# Patient Record
Sex: Female | Born: 1950 | Hispanic: No | Marital: Married | Smoking: Former smoker
Health system: Southern US, Community
[De-identification: ages and names within clinical notes are randomized; demographics above are authoritative.]

## PROBLEM LIST (undated history)

## (undated) DIAGNOSIS — I10 Essential (primary) hypertension: Secondary | ICD-10-CM

## (undated) DIAGNOSIS — E7800 Pure hypercholesterolemia, unspecified: Secondary | ICD-10-CM

## (undated) DIAGNOSIS — E119 Type 2 diabetes mellitus without complications: Secondary | ICD-10-CM

## (undated) HISTORY — PX: VEIN LIGATION: SHX2652

## (undated) HISTORY — PX: TUBAL LIGATION: SHX77

## (undated) HISTORY — DX: Type 2 diabetes mellitus without complications: E11.9

## (undated) HISTORY — DX: Essential (primary) hypertension: I10

## (undated) HISTORY — DX: Pure hypercholesterolemia, unspecified: E78.00

## (undated) HISTORY — PX: COLONOSCOPY: SHX174

---

## 1999-12-17 ENCOUNTER — Encounter: Payer: Self-pay | Admitting: Emergency Medicine

## 1999-12-17 ENCOUNTER — Inpatient Hospital Stay (HOSPITAL_COMMUNITY): Admission: EM | Admit: 1999-12-17 | Discharge: 1999-12-18 | Payer: Self-pay | Admitting: Emergency Medicine

## 2000-03-21 ENCOUNTER — Encounter: Payer: Self-pay | Admitting: Emergency Medicine

## 2000-03-21 ENCOUNTER — Emergency Department (HOSPITAL_COMMUNITY): Admission: EM | Admit: 2000-03-21 | Discharge: 2000-03-21 | Payer: Self-pay | Admitting: Emergency Medicine

## 2001-07-10 ENCOUNTER — Emergency Department (HOSPITAL_COMMUNITY): Admission: EM | Admit: 2001-07-10 | Discharge: 2001-07-10 | Payer: Self-pay | Admitting: Emergency Medicine

## 2002-02-18 ENCOUNTER — Emergency Department (HOSPITAL_COMMUNITY): Admission: EM | Admit: 2002-02-18 | Discharge: 2002-02-19 | Payer: Self-pay | Admitting: Emergency Medicine

## 2002-02-19 ENCOUNTER — Encounter: Payer: Self-pay | Admitting: Emergency Medicine

## 2002-03-10 ENCOUNTER — Encounter: Admission: RE | Admit: 2002-03-10 | Discharge: 2002-06-08 | Payer: Self-pay | Admitting: Family Medicine

## 2004-02-17 ENCOUNTER — Encounter: Admission: RE | Admit: 2004-02-17 | Discharge: 2004-02-17 | Payer: Self-pay | Admitting: Family Medicine

## 2004-09-01 ENCOUNTER — Other Ambulatory Visit: Admission: RE | Admit: 2004-09-01 | Discharge: 2004-09-01 | Payer: Self-pay | Admitting: Family Medicine

## 2006-05-14 ENCOUNTER — Encounter: Admission: RE | Admit: 2006-05-14 | Discharge: 2006-05-23 | Payer: Self-pay | Admitting: Family Medicine

## 2006-07-16 ENCOUNTER — Other Ambulatory Visit: Admission: RE | Admit: 2006-07-16 | Discharge: 2006-07-16 | Payer: Self-pay | Admitting: Family Medicine

## 2007-07-02 ENCOUNTER — Encounter: Admission: RE | Admit: 2007-07-02 | Discharge: 2007-07-02 | Payer: Self-pay | Admitting: Family Medicine

## 2007-08-07 ENCOUNTER — Other Ambulatory Visit: Admission: RE | Admit: 2007-08-07 | Discharge: 2007-08-07 | Payer: Self-pay | Admitting: Family Medicine

## 2008-07-22 ENCOUNTER — Encounter: Admission: RE | Admit: 2008-07-22 | Discharge: 2008-07-22 | Payer: Self-pay | Admitting: Family Medicine

## 2009-02-24 ENCOUNTER — Other Ambulatory Visit: Admission: RE | Admit: 2009-02-24 | Discharge: 2009-02-24 | Payer: Self-pay | Admitting: Family Medicine

## 2009-07-27 ENCOUNTER — Encounter: Admission: RE | Admit: 2009-07-27 | Discharge: 2009-07-27 | Payer: Self-pay | Admitting: Family Medicine

## 2010-07-11 ENCOUNTER — Other Ambulatory Visit: Admission: RE | Admit: 2010-07-11 | Discharge: 2010-07-11 | Payer: Self-pay | Admitting: Family Medicine

## 2010-08-24 ENCOUNTER — Encounter: Admission: RE | Admit: 2010-08-24 | Discharge: 2010-08-24 | Payer: Self-pay | Admitting: Family Medicine

## 2011-07-26 ENCOUNTER — Other Ambulatory Visit: Payer: Self-pay | Admitting: Family Medicine

## 2011-07-26 DIAGNOSIS — Z1231 Encounter for screening mammogram for malignant neoplasm of breast: Secondary | ICD-10-CM

## 2011-08-27 ENCOUNTER — Ambulatory Visit
Admission: RE | Admit: 2011-08-27 | Discharge: 2011-08-27 | Disposition: A | Discharge status: Home / Self Care | Payer: BC Managed Care – PPO | Source: Ambulatory Visit | Attending: Family Medicine | Admitting: Family Medicine

## 2011-08-27 DIAGNOSIS — Z1231 Encounter for screening mammogram for malignant neoplasm of breast: Secondary | ICD-10-CM

## 2011-12-11 ENCOUNTER — Other Ambulatory Visit: Payer: Self-pay | Admitting: Family Medicine

## 2011-12-11 ENCOUNTER — Other Ambulatory Visit (HOSPITAL_COMMUNITY)
Admission: RE | Admit: 2011-12-11 | Discharge: 2011-12-11 | Disposition: A | Discharge status: Home / Self Care | Payer: BC Managed Care – PPO | Source: Ambulatory Visit | Attending: Family Medicine | Admitting: Family Medicine

## 2011-12-11 DIAGNOSIS — Z1159 Encounter for screening for other viral diseases: Secondary | ICD-10-CM | POA: Insufficient documentation

## 2011-12-11 DIAGNOSIS — Z124 Encounter for screening for malignant neoplasm of cervix: Secondary | ICD-10-CM | POA: Insufficient documentation

## 2011-12-11 DIAGNOSIS — Z8673 Personal history of transient ischemic attack (TIA), and cerebral infarction without residual deficits: Secondary | ICD-10-CM

## 2011-12-13 ENCOUNTER — Ambulatory Visit
Admission: RE | Admit: 2011-12-13 | Discharge: 2011-12-13 | Disposition: A | Discharge status: Home / Self Care | Payer: BC Managed Care – PPO | Source: Ambulatory Visit | Attending: Family Medicine | Admitting: Family Medicine

## 2011-12-13 DIAGNOSIS — Z8673 Personal history of transient ischemic attack (TIA), and cerebral infarction without residual deficits: Secondary | ICD-10-CM

## 2012-02-11 ENCOUNTER — Other Ambulatory Visit: Payer: Self-pay | Admitting: Gastroenterology

## 2012-08-20 ENCOUNTER — Other Ambulatory Visit: Payer: Self-pay | Admitting: Family Medicine

## 2012-08-20 DIAGNOSIS — Z1231 Encounter for screening mammogram for malignant neoplasm of breast: Secondary | ICD-10-CM

## 2012-09-19 ENCOUNTER — Ambulatory Visit: Payer: BC Managed Care – PPO

## 2012-10-30 ENCOUNTER — Ambulatory Visit
Admission: RE | Admit: 2012-10-30 | Discharge: 2012-10-30 | Disposition: A | Discharge status: Home / Self Care | Payer: BC Managed Care – PPO | Source: Ambulatory Visit | Attending: Family Medicine | Admitting: Family Medicine

## 2012-10-30 DIAGNOSIS — Z1231 Encounter for screening mammogram for malignant neoplasm of breast: Secondary | ICD-10-CM

## 2013-08-13 ENCOUNTER — Other Ambulatory Visit: Payer: Self-pay | Admitting: Family Medicine

## 2013-08-13 ENCOUNTER — Ambulatory Visit
Admission: RE | Admit: 2013-08-13 | Discharge: 2013-08-13 | Disposition: A | Discharge status: Home / Self Care | Payer: BC Managed Care – PPO | Source: Ambulatory Visit | Attending: Family Medicine | Admitting: Family Medicine

## 2013-08-13 DIAGNOSIS — R3129 Other microscopic hematuria: Secondary | ICD-10-CM

## 2013-09-21 ENCOUNTER — Other Ambulatory Visit: Payer: Self-pay

## 2013-09-21 DIAGNOSIS — Z1231 Encounter for screening mammogram for malignant neoplasm of breast: Secondary | ICD-10-CM

## 2013-11-02 ENCOUNTER — Ambulatory Visit: Payer: BC Managed Care – PPO

## 2013-11-10 ENCOUNTER — Ambulatory Visit
Admission: RE | Admit: 2013-11-10 | Discharge: 2013-11-10 | Disposition: A | Discharge status: Home / Self Care | Payer: BC Managed Care – PPO | Source: Ambulatory Visit

## 2013-11-10 DIAGNOSIS — Z1231 Encounter for screening mammogram for malignant neoplasm of breast: Secondary | ICD-10-CM

## 2013-12-23 ENCOUNTER — Other Ambulatory Visit: Payer: Self-pay | Admitting: Family Medicine

## 2013-12-23 DIAGNOSIS — R7989 Other specified abnormal findings of blood chemistry: Secondary | ICD-10-CM

## 2013-12-23 DIAGNOSIS — R809 Proteinuria, unspecified: Secondary | ICD-10-CM

## 2013-12-23 DIAGNOSIS — E1149 Type 2 diabetes mellitus with other diabetic neurological complication: Secondary | ICD-10-CM

## 2013-12-25 ENCOUNTER — Other Ambulatory Visit: Payer: BC Managed Care – PPO

## 2013-12-28 ENCOUNTER — Ambulatory Visit
Admission: RE | Admit: 2013-12-28 | Discharge: 2013-12-28 | Disposition: A | Discharge status: Home / Self Care | Payer: BC Managed Care – PPO | Source: Ambulatory Visit | Attending: Family Medicine | Admitting: Family Medicine

## 2013-12-28 DIAGNOSIS — R7989 Other specified abnormal findings of blood chemistry: Secondary | ICD-10-CM

## 2013-12-28 DIAGNOSIS — R809 Proteinuria, unspecified: Secondary | ICD-10-CM

## 2013-12-28 DIAGNOSIS — E1149 Type 2 diabetes mellitus with other diabetic neurological complication: Secondary | ICD-10-CM

## 2014-12-31 ENCOUNTER — Other Ambulatory Visit: Payer: Self-pay

## 2014-12-31 DIAGNOSIS — Z1231 Encounter for screening mammogram for malignant neoplasm of breast: Secondary | ICD-10-CM

## 2015-01-05 ENCOUNTER — Ambulatory Visit
Admission: RE | Admit: 2015-01-05 | Discharge: 2015-01-05 | Disposition: A | Discharge status: Home / Self Care | Payer: BLUE CROSS/BLUE SHIELD | Source: Ambulatory Visit

## 2015-01-05 DIAGNOSIS — Z1231 Encounter for screening mammogram for malignant neoplasm of breast: Secondary | ICD-10-CM

## 2015-02-25 ENCOUNTER — Other Ambulatory Visit: Payer: Self-pay | Admitting: Family Medicine

## 2015-02-25 DIAGNOSIS — I639 Cerebral infarction, unspecified: Secondary | ICD-10-CM

## 2015-03-15 ENCOUNTER — Other Ambulatory Visit: Payer: BLUE CROSS/BLUE SHIELD

## 2015-03-21 ENCOUNTER — Ambulatory Visit
Admission: RE | Admit: 2015-03-21 | Discharge: 2015-03-21 | Disposition: A | Discharge status: Home / Self Care | Payer: BLUE CROSS/BLUE SHIELD | Source: Ambulatory Visit | Attending: Family Medicine | Admitting: Family Medicine

## 2015-03-21 DIAGNOSIS — I639 Cerebral infarction, unspecified: Secondary | ICD-10-CM

## 2016-02-29 DIAGNOSIS — Z7984 Long term (current) use of oral hypoglycemic drugs: Secondary | ICD-10-CM | POA: Diagnosis not present

## 2016-02-29 DIAGNOSIS — Z0000 Encounter for general adult medical examination without abnormal findings: Secondary | ICD-10-CM | POA: Diagnosis not present

## 2016-02-29 DIAGNOSIS — I10 Essential (primary) hypertension: Secondary | ICD-10-CM | POA: Diagnosis not present

## 2016-02-29 DIAGNOSIS — E118 Type 2 diabetes mellitus with unspecified complications: Secondary | ICD-10-CM | POA: Diagnosis not present

## 2016-02-29 DIAGNOSIS — E7800 Pure hypercholesterolemia, unspecified: Secondary | ICD-10-CM | POA: Diagnosis not present

## 2016-02-29 DIAGNOSIS — E559 Vitamin D deficiency, unspecified: Secondary | ICD-10-CM | POA: Diagnosis not present

## 2016-03-19 DIAGNOSIS — R809 Proteinuria, unspecified: Secondary | ICD-10-CM | POA: Diagnosis not present

## 2016-03-26 ENCOUNTER — Other Ambulatory Visit: Payer: Self-pay

## 2016-03-26 DIAGNOSIS — Z1231 Encounter for screening mammogram for malignant neoplasm of breast: Secondary | ICD-10-CM

## 2016-04-05 ENCOUNTER — Ambulatory Visit
Admission: RE | Admit: 2016-04-05 | Discharge: 2016-04-05 | Disposition: A | Discharge status: Home / Self Care | Payer: BLUE CROSS/BLUE SHIELD | Source: Ambulatory Visit

## 2016-04-05 DIAGNOSIS — Z1231 Encounter for screening mammogram for malignant neoplasm of breast: Secondary | ICD-10-CM

## 2016-08-08 DIAGNOSIS — Z23 Encounter for immunization: Secondary | ICD-10-CM | POA: Diagnosis not present

## 2016-10-30 DIAGNOSIS — J069 Acute upper respiratory infection, unspecified: Secondary | ICD-10-CM | POA: Diagnosis not present

## 2016-11-19 DIAGNOSIS — S46812A Strain of other muscles, fascia and tendons at shoulder and upper arm level, left arm, initial encounter: Secondary | ICD-10-CM | POA: Diagnosis not present

## 2016-11-29 DIAGNOSIS — G5701 Lesion of sciatic nerve, right lower limb: Secondary | ICD-10-CM | POA: Diagnosis not present

## 2017-01-01 DIAGNOSIS — M6248 Contracture of muscle, other site: Secondary | ICD-10-CM | POA: Diagnosis not present

## 2017-01-01 DIAGNOSIS — M62830 Muscle spasm of back: Secondary | ICD-10-CM | POA: Diagnosis not present

## 2017-01-15 ENCOUNTER — Ambulatory Visit: Payer: BLUE CROSS/BLUE SHIELD | Attending: Family Medicine | Admitting: Physical Therapy

## 2017-01-15 DIAGNOSIS — M545 Low back pain, unspecified: Secondary | ICD-10-CM

## 2017-01-15 DIAGNOSIS — M542 Cervicalgia: Secondary | ICD-10-CM

## 2017-01-15 DIAGNOSIS — R293 Abnormal posture: Secondary | ICD-10-CM | POA: Diagnosis not present
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
## 2017-01-15 NOTE — Patient Instructions (Signed)
   AROM: Neck Rotation   Turn head slowly to look over one shoulder, then the other. Hold each position __3-5__ seconds. Repeat __3_ times per set. Do __1__ sets per session. Do _2-3___ sessions per day.  http://orth.exer.us/294   Copyright  VHI. All rights reserved.  AROM: Lateral Neck Flexion   Slowly tilt head toward one shoulder, then the other. Hold each position __3-5__ seconds. Repeat __3__ times per set. Do _1___ sets per session. Do __3__ sessions per day.  http://orth.exer.us/296   Copyright  VHI. All rights reserved.  Extension   Hands behind neck, bend head back as far as is comfortable. Hold __3__ seconds. Repeat __3__ times. Do __3__ sessions per day.  Copyright  VHI. All rights reserved.  AROM: Neck Flexion   Bend head forward. Hold __3__ seconds. Repeat __3__ times per set. Do _1___ sets per session. Do __3__ sessions per day.  http://orth.exer.us/298   Copyright  VHI. All rights reserved.      Lavinia SharpsStacy Simpson PT [facility] [address], [address] [place], [place] [address] Phone # [phone] Fax [phone]

## 2017-01-17 ENCOUNTER — Encounter: Payer: Self-pay | Admitting: Physical Therapy

## 2017-01-17 ENCOUNTER — Ambulatory Visit: Payer: BLUE CROSS/BLUE SHIELD | Admitting: Physical Therapy

## 2017-01-17 DIAGNOSIS — M542 Cervicalgia: Secondary | ICD-10-CM | POA: Diagnosis not present

## 2017-01-17 DIAGNOSIS — M545 Low back pain, unspecified: Secondary | ICD-10-CM

## 2017-01-17 DIAGNOSIS — R293 Abnormal posture: Secondary | ICD-10-CM | POA: Diagnosis not present

## 2017-01-17 NOTE — Therapy (Signed)
[facility] [address] [place], [place], [address] Phone: [phone]   Fax:  [phone]  Physical Therapy Treatment  Patient Details  Name: Jyoti Harju MRN: [id number] Date of Birth: [birth year] Referring Provider: Dr. Susa Simmonds  Encounter Date: 01/17/2017      PT End of Session - 01/17/17 1625    Visit Number 2   Date for PT Re-Evaluation 03/12/17   PT Start Time 1615   PT Stop Time 1705   PT Time Calculation (min) 50 min   Activity Tolerance Patient tolerated treatment well   Behavior During Therapy [facility] for tasks assessed/performed      History reviewed. No pertinent past medical history.  History reviewed. No pertinent surgical history.  There were no vitals filed for this visit.      Subjective Assessment - 01/17/17 1631    Subjective My neck is feeling a little bit better.    Pertinent History Diabetes, HTN   Limitations House hold activities   How long can you sit comfortably? as long as I want   Diagnostic tests none   Patient Stated Goals less pain   Currently in Pain? Yes   Pain Score 6    Pain Location Neck   Pain Orientation Right;Left   Pain Type Acute pain   Pain Onset More than a month ago   Pain Frequency Intermittent   Aggravating Factors  cold; at night time; looking up or down; turning head   Pain Relieving Factors heat                         [facility] Adult PT Treatment/Exercise - 01/17/17 0001      Therapeutic Activites    Therapeutic Activities Other Therapeutic Activities   Other Therapeutic Activities Pulling, reaching, posture     Exercises   Exercises Shoulder     Shoulder Exercises: Supine   Horizontal ABduction --   Theraband Level (Shoulder Horizontal ABduction) --   External Rotation --   Theraband Level (Shoulder External Rotation) --     Shoulder Exercises: Standing   Extension Strengthening;Both;20 reps;Theraband   Theraband Level  (Shoulder Extension) Level 1 (Yellow)   Row Strengthening;Both;20 reps;Theraband   Theraband Level (Shoulder Row) Level 1 (Yellow)     Shoulder Exercises: ROM/Strengthening   UBE (Upper Arm Bike) L0 x 4 minutes (2x2)  Therapist present to discuss treatment     Shoulder Exercises: Stretch   Corner Stretch --     Moist Heat Therapy   Number Minutes Moist Heat 15 Minutes   Moist Heat Location Cervical     Electrical Stimulation   Electrical Stimulation Location cervical   Electrical Stimulation Action Pre mod   Electrical Stimulation Goals Pain     Manual Therapy   Manual Therapy Soft tissue mobilization   Manual therapy comments Pt seated   Soft tissue mobilization Rt upper trap to decrease tone                  PT Short Term Goals - 01/15/17 1650      PT SHORT TERM GOAL #1   Title The patient will demonstrate and express a good understanding of basic self care strategies to promote healing   02/12/17   Time 4   Period Weeks   Status New     PT SHORT TERM GOAL #2   Title The patient will report a 40% improvement in neck pain with usual home ADLs  and work duties at [employer].     Time 4   Period Weeks   Status New     PT SHORT TERM GOAL #3   Title The patient will have improved cervical ROM with sidebending 40 degrees bilaterally and symmetrical rotation with minimal complaint of pain   Time 4   Period Weeks   Status New     PT SHORT TERM GOAL #4   Title Lumbar flexion improved to 55 degrees and extension to 25 degrees needed for doing laundry   Time 4   Period Weeks   Status New           PT Long Term Goals - 01/15/17 1655      PT LONG TERM GOAL #1   Title The patient will be independent in safe self progression of HEP    03/12/17   Time 8   Period Weeks   Status New     PT LONG TERM GOAL #2   Title The patient will have full shoulder ROM and strength for light lifting needed for home and work ADLS   Time 8   Period Weeks   Status New      PT LONG TERM GOAL #3   Title Pain in neck and back with overall improvement at 60%   Time 8   Period Weeks   Status New     PT LONG TERM GOAL #4   Title Cervical ROM WFLs in all planes with minimal pain needed for home and work ADLS   Time 8   Period Weeks   Status New     PT LONG TERM GOAL #5   Title FOTO functional outcome score improved from 47% limitation to 33% indicating improved function with less pain   Time 8   Period Weeks   Status New               Plan - 01/17/17 1626    Clinical Impression Statement Pt needing maximum verbal and tactile cues to perform strengthening exercises. Pt has very poor posture and weak UE in general. Pt has increase tone in Rt upper trap and does not respond to verbal cues to relax shoudlers. Pt had increased difficulty with backwards direction on UE bike. Pt able to tolerate moderate pressure with manual soft tissue mobilization in Rt upper trap to decrease tone. Pt reponded well to Estim and heat. Pt will continue to benefit from skilled therapy for postural training and UE strengthening.    Rehab Potential Good   PT Frequency 2x / week   PT Duration 8 weeks   PT Treatment/Interventions ADLs/Self Care Home Management;Cryotherapy;Electrical Stimulation;Ultrasound;Traction;Moist Heat;Therapeutic activities;Therapeutic exercise;Neuromuscular re-education;Patient/family education;Manual techniques;Taping;Dry needling   PT Next Visit Plan UE strengthening as tolerated, modalities as needed   Consulted and Agree with Plan of Care Patient      Patient will benefit from skilled therapeutic intervention in order to improve the following deficits and impairments:  Pain, Increased muscle spasms, Decreased range of motion, Postural dysfunction  Visit Diagnosis: Cervicalgia  Acute midline low back pain without sciatica  Abnormal posture     Problem List There are no active problems to display for this patient.   Dessa PhiKatherine Meridee Branum  PTA 01/17/2017, 5:09 PM  [facility] [address], [address] [place], [place], [address] Phone: [phone]   Fax:  [phone]  Name: Lillia MountainClisanta Gordan MRN: [id number] Date of Birth: [birth year]

## 2017-01-23 ENCOUNTER — Ambulatory Visit: Payer: BLUE CROSS/BLUE SHIELD | Admitting: Physical Therapy

## 2017-01-23 DIAGNOSIS — M542 Cervicalgia: Secondary | ICD-10-CM

## 2017-01-23 DIAGNOSIS — R293 Abnormal posture: Secondary | ICD-10-CM | POA: Diagnosis not present

## 2017-01-23 DIAGNOSIS — M545 Low back pain, unspecified: Secondary | ICD-10-CM

## 2017-01-23 NOTE — Therapy (Signed)
[facility] [address], [address] [place], [place], [address] Phone: [phone]   Fax:  [phone]  Physical Therapy Treatment  Patient Details  Name: Carol Mercer MRN: [id number] Date of Birth: 1951-07-25 Referring Provider: Dr. Susa SimmondsVia  Encounter Date: 01/23/2017      PT End of Session - 01/23/17 1528    Visit Number 3   Date for PT Re-Evaluation 03/12/17   PT Start Time 1520   PT Stop Time 1610   PT Time Calculation (min) 50 min   Activity Tolerance Patient tolerated treatment well   Behavior During Therapy [facility] for tasks assessed/performed      No past medical history on file.  No past surgical history on file.  There were no vitals filed for this visit.      Subjective Assessment - 01/23/17 1526    Subjective Neck just a little sore today. Cold weather does not help.    Currently in Pain? Yes   Pain Location Neck   Pain Orientation Right;Left   Pain Descriptors / Indicators Sore   Aggravating Factors  Cold    Pain Relieving Factors Heat   Multiple Pain Sites No                         [facility] Adult PT Treatment/Exercise - 01/23/17 0001      Shoulder Exercises: Supine   Horizontal ABduction Strengthening;Both;20 reps;Theraband  TC for speed and technique   Theraband Level (Shoulder Horizontal ABduction) Level 1 (Yellow)   Horizontal ABduction Limitations Then diagonals 5x each: TC to guide arms correctly     Shoulder Exercises: ROM/Strengthening   UBE (Upper Arm Bike) L1 3x3  Tried towel roll at lumbar, keept falling out     Moist Heat Therapy   Number Minutes Moist Heat 15 Minutes   Moist Heat Location Cervical     Electrical Stimulation   Electrical Stimulation Location cervical   Electrical Stimulation Action IFC   Electrical Stimulation Goals Pain     Manual Therapy   Manual Therapy Soft tissue mobilization   Manual therapy comments Supine   Soft tissue mobilization  Cervical     Neck Exercises: Stretches   Other Neck Stretches CerviCAL RELEASE with ball : nods and turns 20x each                   PT Short Term Goals - 01/23/17 1557      PT SHORT TERM GOAL #1   Title The patient will demonstrate and express a good understanding of basic self care strategies to promote healing   02/12/17   Time 4   Period Weeks   Status On-going     PT SHORT TERM GOAL #2   Title The patient will report a 40% improvement in neck pain with usual home ADLs and work duties at [employer].     Time 4   Period Weeks   Status On-going           PT Long Term Goals - 01/15/17 1655      PT LONG TERM GOAL #1   Title The patient will be independent in safe self progression of HEP    03/12/17   Time 8   Period Weeks   Status New     PT LONG TERM GOAL #2   Title The patient will have full shoulder ROM and strength for light lifting needed for home and work [employer]  Time 8   Period Weeks   Status New     PT LONG TERM GOAL #3   Title Pain in neck and back with overall improvement at 60%   Time 8   Period Weeks   Status New     PT LONG TERM GOAL #4   Title Cervical ROM WFLs in all planes with minimal pain needed for home and work ADLS   Time 8   Period Weeks   Status New     PT LONG TERM GOAL #5   Title FOTO functional outcome score improved from 47% limitation to 33% indicating improved function with less pain   Time 8   Period Weeks   Status New               Plan - 01/23/17 1529    Clinical Impression Statement Pt requires tactile cues for scapular band exercises. Reduced body awareness. Soft tissues felt pretty supple at the cervical spine. Pt very short at her neck, PTA advised pt to be as tall as she could, no slumping. Unsure if pt understood concept.  End of sesion pt reported vey little pain 2./10.    Rehab Potential Good   PT Frequency 2x / week   PT Duration 8 weeks   PT Treatment/Interventions ADLs/Self Care Home  Management;Cryotherapy;Electrical Stimulation;Ultrasound;Traction;Moist Heat;Therapeutic activities;Therapeutic exercise;Neuromuscular re-education;Patient/family education;Manual techniques;Taping;Dry needling   PT Next Visit Plan Decompression exercises to the cervical spine, postural endurance exercises, Estim/heat.    Consulted and Agree with Plan of Care --      Patient will benefit from skilled therapeutic intervention in order to improve the following deficits and impairments:  Pain, Increased muscle spasms, Decreased range of motion, Postural dysfunction  Visit Diagnosis: Cervicalgia  Acute midline low back pain without sciatica  Abnormal posture     Problem List There are no active problems to display for this patient.   Leyton Magoon, PTA 01/23/2017, 4:02 PM  [facility] [address], [address] [place], [place], [address] Phone: [phone]   Fax:  [phone]  Name: Carol Mercer MRN: [id number] Date of Birth: 12-08-1950

## 2017-01-28 ENCOUNTER — Ambulatory Visit: Payer: BLUE CROSS/BLUE SHIELD | Admitting: Physical Therapy

## 2017-01-28 ENCOUNTER — Encounter: Payer: Self-pay | Admitting: Physical Therapy

## 2017-01-28 DIAGNOSIS — R293 Abnormal posture: Secondary | ICD-10-CM | POA: Diagnosis not present

## 2017-01-28 DIAGNOSIS — M542 Cervicalgia: Secondary | ICD-10-CM | POA: Diagnosis not present

## 2017-01-28 DIAGNOSIS — M545 Low back pain, unspecified: Secondary | ICD-10-CM

## 2017-01-28 NOTE — Therapy (Signed)
[facility] [address] [place], [place], [address] Phone: [phone]   Fax:  [phone]  Physical Therapy Treatment  Patient Details  Name: Carol Mercer MRN: [id number] Date of Birth: 1951/07/02 Referring Provider: Dr. Susa Simmonds  Encounter Date: 01/28/2017      PT End of Session - 01/28/17 1535    Visit Number 4   Date for PT Re-Evaluation 03/12/17   PT Start Time 1530   PT Stop Time 1608   PT Time Calculation (min) 38 min   Activity Tolerance Patient tolerated treatment well   Behavior During Therapy [facility] for tasks assessed/performed      History reviewed. No pertinent past medical history.  History reviewed. No pertinent surgical history.  There were no vitals filed for this visit.      Subjective Assessment - 01/28/17 1534    Subjective Pt reports neck doing better, denies pain today.    Pertinent History Diabetes, HTN   Limitations House hold activities   How long can you sit comfortably? as long as I want   Diagnostic tests none   Patient Stated Goals less pain   Currently in Pain? No/denies   Pain Score 0-No pain                         [facility] Adult PT Treatment/Exercise - 01/28/17 0001      Shoulder Exercises: Supine   Horizontal ABduction Strengthening;Both;20 reps;Theraband  TC for speed and technique   Theraband Level (Shoulder Horizontal ABduction) Level 1 (Yellow)   Horizontal ABduction Limitations Then diagonals 5x each: TC to guide arms correctly   Other Supine Exercises Shoulder extension  Hooklying     Shoulder Exercises: ROM/Strengthening   UBE (Upper Arm Bike) L1 x 8 minutes  Nustep with UE movement; therapist present     Manual Therapy   Manual Therapy Soft tissue mobilization   Manual therapy comments Pt seated   Soft tissue mobilization Rt upper trap to decrease tone                  PT Short Term Goals - 01/23/17 1557      PT SHORT TERM  GOAL #1   Title The patient will demonstrate and express a good understanding of basic self care strategies to promote healing   02/12/17   Time 4   Period Weeks   Status On-going     PT SHORT TERM GOAL #2   Title The patient will report a 40% improvement in neck pain with usual home ADLs and work duties at [employer].     Time 4   Period Weeks   Status On-going           PT Long Term Goals - 01/15/17 1655      PT LONG TERM GOAL #1   Title The patient will be independent in safe self progression of HEP    03/12/17   Time 8   Period Weeks   Status New     PT LONG TERM GOAL #2   Title The patient will have full shoulder ROM and strength for light lifting needed for home and work ADLS   Time 8   Period Weeks   Status New     PT LONG TERM GOAL #3   Title Pain in neck and back with overall improvement at 60%   Time 8   Period Weeks   Status New  PT LONG TERM GOAL #4   Title Cervical ROM WFLs in all planes with minimal pain needed for home and work ADLS   Time 8   Period Weeks   Status New     PT LONG TERM GOAL #5   Title FOTO functional outcome score improved from 47% limitation to 33% indicating improved function with less pain   Time 8   Period Weeks   Status New             Patient will benefit from skilled therapeutic intervention in order to improve the following deficits and impairments:     Visit Diagnosis: Cervicalgia  Acute midline low back pain without sciatica  Abnormal posture     Problem List There are no active problems to display for this patient.   Dessa PhiKatherine Matthews PTA 01/28/2017, 5:20 PM  [facility] [address], [address] [place], [place], [address] Phone: [phone]   Fax:  [phone]  Name: Lillia MountainClisanta Vanscyoc MRN: [id number] Date of Birth: Jan 07, 1951

## 2017-01-29 ENCOUNTER — Encounter: Payer: BLUE CROSS/BLUE SHIELD | Admitting: Physical Therapy

## 2017-01-31 ENCOUNTER — Ambulatory Visit: Payer: BLUE CROSS/BLUE SHIELD | Admitting: Physical Therapy

## 2017-01-31 DIAGNOSIS — M545 Low back pain, unspecified: Secondary | ICD-10-CM

## 2017-01-31 DIAGNOSIS — R293 Abnormal posture: Secondary | ICD-10-CM

## 2017-01-31 DIAGNOSIS — M542 Cervicalgia: Secondary | ICD-10-CM

## 2017-01-31 NOTE — Therapy (Signed)
[facility] [address] [place], [place], [address] Phone: [phone]   Fax:  [phone]  Physical Therapy Treatment  Patient Details  Name: Carol Mercer MRN: [id number] Date of Birth: [birth year] Referring Provider: Dr. Lynelle Doctor  Encounter Date: 01/31/2017      PT End of Session - 01/31/17 1500    Visit Number 5   Date for PT Re-Evaluation 03/12/17   PT Start Time 5956   PT Stop Time 1529   PT Time Calculation (min) 44 min   Activity Tolerance Patient tolerated treatment well      No past medical history on file.  No past surgical history on file.  There were no vitals filed for this visit.      Subjective Assessment - 01/31/17 1448    Subjective Patient states she has been feeling better.  No pain today.  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     Currently in Pain? No/denies   Pain Score 0-No pain   Pain Type Acute pain   Pain Onset More than a month ago   Pain Frequency Intermittent   Aggravating Factors  cold weather   Pain Relieving Factors heat            [facility] PT Assessment - 01/31/17 0001      AROM   Cervical Flexion 80   Cervical Extension 50   Cervical - Right Side Bend 35   Cervical - Left Side Bend 30   Cervical - Right Rotation 50   Cervical - Left Rotation 50   Lumbar Flexion 50   Lumbar Extension 20   Lumbar - Right Side Bend 35   Lumbar - Left Side Bend 35                     [facility] Adult PT Treatment/Exercise - 01/31/17 0001      Therapeutic Activites    Other Therapeutic Activities Pulling, reaching, posture     Neuro Re-ed    Neuro Re-ed Details  middle and lower trap and latissimus activation      Shoulder Exercises: Supine   Other Supine Exercises yellow band scapular stabilization series 10x each narrow overhead, wide overhead, horizontal abduction, sash, external rotation      Shoulder Exercises: ROM/Strengthening   Other  ROM/Strengthening Exercises supine foam roll circles, nods, rotations 5x   Other ROM/Strengthening Exercises sidelying foam roll circles 5x right/left     Shoulder Exercises: Power Tower   Extension 10 reps   Extension Limitations 25#   Row 10 reps   Row Limitations 25#   Other Power Chief Technology Officer extensions 20# 10x right and left     Moist Heat Therapy   Number Minutes Moist Heat 15 Minutes   Moist Heat Location Cervical     Electrical Stimulation   Electrical Stimulation Location cervical   Electrical Stimulation Action IFC   Electrical Stimulation Parameters to tolerance seated   Electrical Stimulation Goals Pain                  PT Short Term Goals - 01/31/17 1639      PT SHORT TERM GOAL #1   Title The patient will demonstrate and express a good understanding of basic self care strategies to promote healing   02/12/17   Status Achieved     PT SHORT TERM GOAL #2   Title The patient will report a 40% improvement in neck pain with usual  home ADLs and work duties at [employer].     Status Achieved     PT SHORT TERM GOAL #3   Title The patient will have improved cervical ROM with sidebending 40 degrees bilaterally and symmetrical rotation with minimal complaint of pain   Status Partially Met     PT SHORT TERM GOAL #4   Title Lumbar flexion improved to 55 degrees and extension to 25 degrees needed for doing laundry   Status Achieved           PT Long Term Goals - 01/31/17 1639      PT LONG TERM GOAL #1   Title The patient will be independent in safe self progression of HEP    03/12/17   Time 8   Period Weeks   Status On-going     PT LONG TERM GOAL #2   Title The patient will have full shoulder ROM and strength for light lifting needed for home and work ADLS   Time 8   Period Weeks   Status On-going     PT LONG TERM GOAL #3   Title Pain in neck and back with overall improvement at 60%   Status Achieved     PT LONG TERM GOAL #4   Title  Cervical ROM WFLs in all planes with minimal pain needed for home and work ADLS   Time 8   Period Weeks   Status On-going     PT LONG TERM GOAL #5   Title FOTO functional outcome score improved from 47% limitation to 33% indicating improved function with less pain   Time 8   Period Weeks   Status On-going               Plan - 01/31/17 1501    Clinical Impression Statement The patient is improving with postural, scapular and cervical muscle strengthening with minimal pain production.  Patient states she is overall 80% better.  Sleeping OK.  Progressing well with cervical and lumbar ROM in all planes.  Increased lumbar discomfort with endrange flexion.  Prefers to sit for modalities secondary to dizziness with lying down.  May not need modalities anymore as pain intensity is decreasing.  Progressing with short and long term goals.   PT Next Visit Plan Check shoulder ROM;  cervical ROM ex (sidebending and rotation);  continue functional reactivation and postural strengthening,  try single knee to chest and lumbar rotation      Patient will benefit from skilled therapeutic intervention in order to improve the following deficits and impairments:     Visit Diagnosis: Cervicalgia  Acute midline low back pain without sciatica  Abnormal posture     Problem List There are no active problems to display for this patient.  Ruben Im, PT 01/31/17 4:42 PM Phone: [phone] Fax: [phone]  Carol Mercer 01/31/2017, 4:42 PM  [facility] [address] [place], [place], [address] Phone: [phone]   Fax:  [phone]  Name: Carol Mercer MRN: [id number] Date of Birth: May 01, 1951

## 2017-02-05 ENCOUNTER — Ambulatory Visit: Payer: BLUE CROSS/BLUE SHIELD | Attending: Family Medicine | Admitting: Physical Therapy

## 2017-02-05 ENCOUNTER — Encounter: Payer: Self-pay | Admitting: Physical Therapy

## 2017-02-05 DIAGNOSIS — M545 Low back pain, unspecified: Secondary | ICD-10-CM

## 2017-02-05 DIAGNOSIS — M542 Cervicalgia: Secondary | ICD-10-CM | POA: Diagnosis not present

## 2017-02-05 DIAGNOSIS — R293 Abnormal posture: Secondary | ICD-10-CM | POA: Insufficient documentation

## 2017-02-05 NOTE — Therapy (Signed)
[facility] [address], [place], [place], [address] Phone: [phone]   Fax:  [phone]  Physical Therapy Treatment  Patient Details  Name: Carol Mercer MRN: [id number] Date of Birth: August 25, 1951 Referring Provider: Dr. Lynelle Doctor  Encounter Date: 02/05/2017      PT End of Session - 02/05/17 1455    Visit Number 6   Date for PT Re-Evaluation 03/12/17   PT Start Time 1448   PT Stop Time 1543   PT Time Calculation (min) 55 min   Activity Tolerance Patient tolerated treatment well   Behavior During Therapy [facility] for tasks assessed/performed      History reviewed. No pertinent past medical history.  History reviewed. No pertinent surgical history.  There were no vitals filed for this visit.      Subjective Assessment - 02/05/17 1453    Subjective NO pain today.  Has not been doing anything heavy at work so she can manage.   Limitations House hold activities   Currently in Pain? No/denies            [facility] PT Assessment - 02/05/17 0001      AROM   Cervical Flexion 85   Cervical Extension 50   Cervical - Right Side Bend 25   Cervical - Left Side Bend 30   Cervical - Right Rotation 45   Cervical - Left Rotation 55                     [facility] Adult PT Treatment/Exercise - 02/05/17 0001      Shoulder Exercises: Supine   Horizontal ABduction Limitations diagonals 10x with yellow band   Other Supine Exercises yellow band scapular stabilization series 10x each narrow overhead, wide overhead, horizontal abduction, sash, external rotation    Other Supine Exercises cervical retraction into pillow - 5sec hold x10     Shoulder Exercises: ROM/Strengthening   UBE (Upper Arm Bike) L2 3x3  Tried towel roll at lumbar, keept falling out   Other ROM/Strengthening Exercises supine foam roll circles, nods, rotations 5x   Other ROM/Strengthening Exercises sidelying foam roll circles 5x right/left     Shoulder Exercises: Power Development worker, community 10 reps   Row 10 reps   Row Limitations 25#     Moist Heat Therapy   Number Minutes Moist Heat 15 Minutes   Moist Heat Location Cervical     Electrical Stimulation   Electrical Stimulation Location cervical   Electrical Stimulation Action IFC   Electrical Stimulation Parameters to tolerance in sitting   Electrical Stimulation Goals Pain     Manual Therapy   Manual Therapy Soft tissue mobilization;Passive ROM   Manual therapy comments supine   Soft tissue mobilization suboccipitals and cervical paraspinals                  PT Short Term Goals - 01/31/17 1639      PT SHORT TERM GOAL #1   Title The patient will demonstrate and express a good understanding of basic self care strategies to promote healing   02/12/17   Status Achieved     PT SHORT TERM GOAL #2   Title The patient will report a 40% improvement in neck pain with usual home ADLs and work duties at [employer].     Status Achieved     PT SHORT TERM GOAL #3   Title The patient will have improved cervical ROM with sidebending 40 degrees bilaterally and symmetrical rotation  with minimal complaint of pain   Status Partially Met     PT SHORT TERM GOAL #4   Title Lumbar flexion improved to 55 degrees and extension to 25 degrees needed for doing laundry   Status Achieved           PT Long Term Goals - 01/31/17 1639      PT LONG TERM GOAL #1   Title The patient will be independent in safe self progression of HEP    03/12/17   Time 8   Period Weeks   Status On-going     PT LONG TERM GOAL #2   Title The patient will have full shoulder ROM and strength for light lifting needed for home and work ADLS   Time 8   Period Weeks   Status On-going     PT LONG TERM GOAL #3   Title Pain in neck and back with overall improvement at 60%   Status Achieved     PT LONG TERM GOAL #4   Title Cervical ROM WFLs in all planes with minimal pain needed for home and work ADLS    Time 8   Period Weeks   Status On-going     PT LONG TERM GOAL #5   Title FOTO functional outcome score improved from 47% limitation to 33% indicating improved function with less pain   Time 8   Period Weeks   Status On-going               Plan - 02/05/17 1455    Clinical Impression Statement Patient demonstrates some improved ROM but still has difficulty with cervical rotation.  Needs a lot of cues for lenghtening of the spine and improved upright posture and reduced use of upper traps with exercises.  Pt continues to need skilled PT for cervical ROM and postureal strength.    Rehab Potential Good   PT Treatment/Interventions ADLs/Self Care Home Management;Cryotherapy;Electrical Stimulation;Ultrasound;Traction;Moist Heat;Therapeutic activities;Therapeutic exercise;Neuromuscular re-education;Patient/family education;Manual techniques;Taping;Dry needling   PT Next Visit Plan cervical ROM ex (sidebending and rotation);  continue functional reactivation and postural strengthening,  try single knee to chest and lumbar and thoracic rotation   Consulted and Agree with Plan of Care Patient      Patient will benefit from skilled therapeutic intervention in order to improve the following deficits and impairments:  Pain, Increased muscle spasms, Decreased range of motion, Postural dysfunction  Visit Diagnosis: Cervicalgia  Acute midline low back pain without sciatica  Abnormal posture     Problem List There are no active problems to display for this patient.   Zannie Cove, PT 02/05/2017, 5:29 PM  [facility] [address], [place], [place], [address] Phone: [phone]   Fax:  [phone]  Name: Carol Mercer MRN: [id number] Date of Birth: 1951-05-27

## 2017-02-07 ENCOUNTER — Ambulatory Visit: Payer: BLUE CROSS/BLUE SHIELD | Admitting: Physical Therapy

## 2017-02-07 DIAGNOSIS — M545 Low back pain, unspecified: Secondary | ICD-10-CM

## 2017-02-07 DIAGNOSIS — R293 Abnormal posture: Secondary | ICD-10-CM

## 2017-02-07 DIAGNOSIS — M542 Cervicalgia: Secondary | ICD-10-CM | POA: Diagnosis not present

## 2017-02-07 NOTE — Therapy (Signed)
[facility] [address], [place], [place], [address] Phone: [phone]   Fax:  [phone]  Physical Therapy Treatment  Patient Details  Name: Carol Mercer MRN: [id number] Date of Birth: 09-14-1951 Referring Provider: Dr. Lynelle Doctor  Encounter Date: 02/07/2017      PT End of Session - 02/07/17 1512    Visit Number 7   Date for PT Re-Evaluation 03/12/17   PT Start Time 4917   PT Stop Time 1523   PT Time Calculation (min) 38 min   Activity Tolerance Patient tolerated treatment well      No past medical history on file.  No past surgical history on file.  There were no vitals filed for this visit.      Subjective Assessment - 02/07/17 1447    Subjective No pain in neck or back at present but states in the mornings when she goes in to work but then the pain goes away.     Currently in Pain? No/denies   Pain Score 0-No pain                         [facility] Adult PT Treatment/Exercise - 02/07/17 0001      Therapeutic Activites    Other Therapeutic Activities Pulling, reaching, posture     Neuro Re-ed    Neuro Re-ed Details  middle and lower trap and latissimus activation      Lumbar Exercises: Supine   Ab Set 10 reps  with ball   Bridge 10 reps   Bridge Limitations with red ball   Isometric Hip Flexion 10 reps   Other Supine Lumbar Exercises lumbar flexion with heels on red ball 15x   Other Supine Lumbar Exercises lumbar rotation with LEs on red ball 15x     Knee/Hip Exercises: Aerobic   Nustep L1 10 min seat 4 UEs/LEs     Shoulder Exercises: Standing   Other Standing Exercises against wall UE movements 10x each   Other Standing Exercises wall push ups 10x     Shoulder Exercises: ROM/Strengthening   UBE (Upper Arm Bike) L2 3x3  Tried towel roll at lumbar, keept falling out   Other ROM/Strengthening Exercises supine foam roll circles, nods, rotations 5x   Other ROM/Strengthening  Exercises sidelying foam roll circles 5x right/left     Shoulder Exercises: Power Development worker, community 20 reps   Extension Limitations 25#   Row 20 reps   Row Limitations 25#   Other Power Tower Exercises red band diagonal extensions 15x tandem stand                  PT Short Term Goals - 02/07/17 1505      PT SHORT TERM GOAL #1   Title The patient will demonstrate and express a good understanding of basic self care strategies to promote healing   02/12/17   Status Achieved     PT SHORT TERM GOAL #2   Title The patient will report a 40% improvement in neck pain with usual home ADLs and work duties at [employer].     Status Achieved     PT SHORT TERM GOAL #3   Title The patient will have improved cervical ROM with sidebending 40 degrees bilaterally and symmetrical rotation with minimal complaint of pain   Time 4   Period Weeks   Status Partially Met     PT SHORT TERM GOAL #4   Title Lumbar flexion  improved to 55 degrees and extension to 25 degrees needed for doing laundry   Status Achieved           PT Long Term Goals - 02/07/17 1506      PT LONG TERM GOAL #1   Title The patient will be independent in safe self progression of HEP    03/12/17   Time 8   Period Weeks   Status On-going     PT LONG TERM GOAL #2   Title The patient will have full shoulder ROM and strength for light lifting needed for home and work ADLS   Time 8   Period Weeks   Status On-going     PT LONG TERM GOAL #3   Title Pain in neck and back with overall improvement at 60%   Status Achieved     PT LONG TERM GOAL #4   Title Cervical ROM WFLs in all planes with minimal pain needed for home and work ADLS   Time 8   Period Weeks   Status On-going     PT LONG TERM GOAL #5   Title FOTO functional outcome score improved from 47% limitation to 33% indicating improved function with less pain   Time 8   Period Weeks               Plan - 02/07/17 1512    Clinical Impression Statement  The patient reports decreasing frequency and intensity of neck and back pain.  Patient expresses concern that the pain will return although she agrees that if she is still doing well next Tuesday, we would cancel her Thursday appt and continue to taper appointment frequency.  She declines the need for modalities today.  She reports muscular fatigue but not the usual pain following exercise.     PT Next Visit Plan if doing well next week, decrease treatment frequency to 1x/week;  postural strengthening;  cervical rotation ROM;  scapular strengthening;  core strengthening;  bird dogs      Patient will benefit from skilled therapeutic intervention in order to improve the following deficits and impairments:     Visit Diagnosis: Cervicalgia  Acute midline low back pain without sciatica  Abnormal posture     Problem List There are no active problems to display for this patient.  Ruben Im, PT 02/07/17 3:18 PM Phone: [phone] Fax: [phone]  [name] 02/07/2017, 3:17 PM  [facility] [address] [place], [place], [address] Phone: [phone]   Fax:  [phone]  Name: Carol Mercer MRN: [id number] Date of Birth: Oct 07, 1951

## 2017-02-12 ENCOUNTER — Ambulatory Visit: Payer: BLUE CROSS/BLUE SHIELD | Admitting: Physical Therapy

## 2017-02-12 DIAGNOSIS — M545 Low back pain, unspecified: Secondary | ICD-10-CM

## 2017-02-12 DIAGNOSIS — M542 Cervicalgia: Secondary | ICD-10-CM

## 2017-02-12 DIAGNOSIS — R293 Abnormal posture: Secondary | ICD-10-CM | POA: Diagnosis not present

## 2017-02-12 NOTE — Therapy (Signed)
[facility] [address] [place], [place], [address] Phone: [phone]   Fax:  [phone]  Physical Therapy Treatment  Patient Details  Name: Carol Mercer MRN: [id number] Date of Birth: Aug 04, 1951 Referring Provider: Dr. Lynelle Doctor  Encounter Date: 02/12/2017      PT End of Session - 02/12/17 1514    Visit Number 8   Date for PT Re-Evaluation 03/12/17   PT Start Time 4259   PT Stop Time 1523   PT Time Calculation (min) 38 min   Activity Tolerance Patient tolerated treatment well      No past medical history on file.  No past surgical history on file.  There were no vitals filed for this visit.      Subjective Assessment - 02/12/17 1449    Subjective Patient reports she is feeling good and feels ready to decrease frequency to 1x/week.  Worked today.     Currently in Pain? No/denies   Pain Score 0-No pain   Pain Location Neck   Pain Type Acute pain   Aggravating Factors  cold weather   Pain Relieving Factors heat                         [facility] Adult PT Treatment/Exercise - 02/12/17 0001      Therapeutic Activites    Other Therapeutic Activities Pulling, reaching, posture     Neuro Re-ed    Neuro Re-ed Details  middle and lower trap and latissimus activation      Knee/Hip Exercises: Aerobic   Nustep L1 10 min seat 4 UEs/LEs     Shoulder Exercises: Supine   Other Supine Exercises on foam roll red band horizontal abduction, diagonals, external rotation 10x each     Shoulder Exercises: Standing   Other Standing Exercises wall climbs UE/LE 10x   Other Standing Exercises red band UE diagonals in tandem stand     Shoulder Exercises: ROM/Strengthening   UBE (Upper Arm Bike) L2 3x3  Tried towel roll at lumbar, keept falling out   Other ROM/Strengthening Exercises vertical foam roll with UE movements.      Shoulder Exercises: Power Development worker, community 20 reps   Extension Limitations  25#   Row 10 reps  seated   Row Limitations 25#   Other Power [employer] Exercises lat bar 25# 10x                  PT Short Term Goals - 02/12/17 1517      PT SHORT TERM GOAL #1   Title The patient will demonstrate and express a good understanding of basic self care strategies to promote healing   02/12/17   Status Achieved     PT SHORT TERM GOAL #2   Title The patient will report a 40% improvement in neck pain with usual home ADLs and work duties at [employer].     Status Achieved     PT SHORT TERM GOAL #3   Title The patient will have improved cervical ROM with sidebending 40 degrees bilaterally and symmetrical rotation with minimal complaint of pain   Time 4   Period Weeks   Status Partially Met     PT SHORT TERM GOAL #4   Title Lumbar flexion improved to 55 degrees and extension to 25 degrees needed for doing laundry   Status Achieved           PT Long Term Goals - 02/12/17 1518  PT LONG TERM GOAL #1   Title The patient will be independent in safe self progression of HEP    03/12/17   Time 8   Period Weeks   Status On-going     PT LONG TERM GOAL #2   Title The patient will have full shoulder ROM and strength for light lifting needed for home and work ADLS   Time 8   Period Weeks   Status On-going     PT LONG TERM GOAL #3   Title Pain in neck and back with overall improvement at 60%   Status Achieved     PT LONG TERM GOAL #4   Title Cervical ROM WFLs in all planes with minimal pain needed for home and work ADLS   Time 8   Period Weeks   Status On-going     PT LONG TERM GOAL #5   Title FOTO functional outcome score improved from 47% limitation to 33% indicating improved function with less pain   Time 8   Period Weeks   Status On-going               Plan - 02/12/17 1514    Clinical Impression Statement The patient reports no neck or back pain.  She is able to perform moderate intensity postural strengthening without production of pain.   She does need verbal and tactile cues to avoid upper trap compensatory shrug with pulling motions.  Will decrease treatment frequency and anticipate remaining goals to be met in next 1-2 visits.     PT Next Visit Plan  postural strengthening;  cervical rotation ROM;  scapular strengthening;  core strengthening;  bird dogs      Patient will benefit from skilled therapeutic intervention in order to improve the following deficits and impairments:     Visit Diagnosis: Cervicalgia  Acute midline low back pain without sciatica  Abnormal posture     Problem List There are no active problems to display for this patient.  Ruben Im, PT 02/12/17 3:19 PM Phone: [phone] Fax: [phone]  [name] 02/12/2017, 3:19 PM  [facility] [address], [place], [place], [address] Phone: [phone]   Fax:  [phone]  Name: Carol Mercer MRN: [id number] Date of Birth: 08-15-1951

## 2017-02-14 ENCOUNTER — Encounter: Payer: BLUE CROSS/BLUE SHIELD | Admitting: Physical Therapy

## 2017-02-19 ENCOUNTER — Encounter: Payer: BLUE CROSS/BLUE SHIELD | Admitting: Physical Therapy

## 2017-02-20 ENCOUNTER — Ambulatory Visit: Payer: BLUE CROSS/BLUE SHIELD

## 2017-02-20 DIAGNOSIS — M542 Cervicalgia: Secondary | ICD-10-CM | POA: Diagnosis not present

## 2017-02-20 DIAGNOSIS — R293 Abnormal posture: Secondary | ICD-10-CM

## 2017-02-20 DIAGNOSIS — M545 Low back pain, unspecified: Secondary | ICD-10-CM

## 2017-02-20 NOTE — Therapy (Signed)
[facility] [address], [place], [place], [address] Phone: [phone]   Fax:  [phone]  Physical Therapy Treatment  Patient Details  Name: Carol Mercer MRN: [id number] Date of Birth: Apr 09, 1951 Referring Provider: Dr. Lynelle Doctor  Encounter Date: 02/20/2017      PT End of Session - 02/20/17 1423    Visit Number 9   Date for PT Re-Evaluation 03/12/17   PT Start Time 6283   PT Stop Time 1428   PT Time Calculation (min) 34 min   Activity Tolerance Patient tolerated treatment well   Behavior During Therapy [facility] for tasks assessed/performed      History reviewed. No pertinent past medical history.  History reviewed. No pertinent surgical history.  There were no vitals filed for this visit.      Subjective Assessment - 02/20/17 1355    Subjective Doing much better.  Exercises are helping.  Pt just got off work.   Currently in Pain? No/denies            [facility] PT Assessment - 02/20/17 0001      ROM / Strength   AROM / PROM / Strength AROM     AROM   Overall AROM  Within functional limits for tasks performed   Overall AROM Comments full cervical AROM                     [facility] Adult PT Treatment/Exercise - 02/20/17 0001      Lumbar Exercises: Supine   Bridge 10 reps   Bridge Limitations with red ball   Other Supine Lumbar Exercises lumbar rotation with LEs on red ball 15x     Knee/Hip Exercises: Aerobic   Nustep L1 10 min seat 4 UEs/LEs  PT present to discuss progress     Shoulder Exercises: Supine   Other Supine Exercises on foam roll red band horizontal abduction, diagonals, external rotation 2x10 each     Shoulder Exercises: Standing   Other Standing Exercises wall push ups 20x     Shoulder Exercises: ROM/Strengthening   UBE (Upper Arm Bike) L2 3x3     Shoulder Exercises: Power Development worker, community 20 reps   Extension Limitations 25#   Row 10 reps  seated   Row Limitations  25#                  PT Short Term Goals - 02/12/17 1517      PT SHORT TERM GOAL #1   Title The patient will demonstrate and express a good understanding of basic self care strategies to promote healing   02/12/17   Status Achieved     PT SHORT TERM GOAL #2   Title The patient will report a 40% improvement in neck pain with usual home ADLs and work duties at [employer].     Status Achieved     PT SHORT TERM GOAL #3   Title The patient will have improved cervical ROM with sidebending 40 degrees bilaterally and symmetrical rotation with minimal complaint of pain   Time 4   Period Weeks   Status Partially Met     PT SHORT TERM GOAL #4   Title Lumbar flexion improved to 55 degrees and extension to 25 degrees needed for doing laundry   Status Achieved           PT Long Term Goals - 02/20/17 1356      PT LONG TERM GOAL #1   Title  The patient will be independent in safe self progression of HEP    03/12/17   Time 8   Period Weeks   Status On-going     PT LONG TERM GOAL #3   Title Pain in neck and back with overall improvement at 60%   Baseline 60% improvement   Status Achieved               Plan - 02/20/17 1410    Clinical Impression Statement Pt reports 60% overall improvement in symptoms since the start of care.  Pt with full cervical and UE A/ROM without pain.  Pt tolerates all exercise in the clinic without limitation.  Pt does require verbal and tactile cues to reduce substitution.  Pt will continue to benefit from PT for 1-3 more sessions to improve strength and mobility.     PT Treatment/Interventions ADLs/Self Care Home Management;Cryotherapy;Electrical Stimulation;Ultrasound;Traction;Moist Heat;Therapeutic activities;Therapeutic exercise;Neuromuscular re-education;Patient/family education;Manual techniques;Taping;Dry needling   PT Next Visit Plan  postural strengthening;  cervical rotation ROM;  scapular strengthening;  core strengthening   Consulted and  Agree with Plan of Care Patient      Patient will benefit from skilled therapeutic intervention in order to improve the following deficits and impairments:  Pain, Increased muscle spasms, Decreased range of motion, Postural dysfunction  Visit Diagnosis: Cervicalgia  Acute midline low back pain without sciatica  Abnormal posture     Problem List There are no active problems to display for this patient.    Carol Mercer, PT 02/20/17 2:25 PM  [facility] [address] [place], [place], [address] Phone: [phone]   Fax:  [phone]  Name: Carol Mercer MRN: [id number] Date of Birth: Nov 17, 1950

## 2017-02-21 ENCOUNTER — Encounter: Payer: BLUE CROSS/BLUE SHIELD | Admitting: Physical Therapy

## 2017-02-26 ENCOUNTER — Encounter: Payer: BLUE CROSS/BLUE SHIELD | Admitting: Physical Therapy

## 2017-02-27 ENCOUNTER — Encounter: Payer: Self-pay | Admitting: Physical Therapy

## 2017-02-27 ENCOUNTER — Ambulatory Visit: Payer: BLUE CROSS/BLUE SHIELD | Admitting: Physical Therapy

## 2017-02-27 DIAGNOSIS — M542 Cervicalgia: Secondary | ICD-10-CM

## 2017-02-27 DIAGNOSIS — M545 Low back pain, unspecified: Secondary | ICD-10-CM

## 2017-02-27 DIAGNOSIS — R293 Abnormal posture: Secondary | ICD-10-CM

## 2017-02-27 NOTE — Therapy (Addendum)
[facility] [address], [place], [place], [address] Phone: [phone]   Fax:  [phone]  Physical Therapy Treatment  Patient Details  Name: Carol Mercer MRN: [id number] Date of Birth: Jan 31, 1951 Referring Provider: Dr. Lynelle Doctor  Encounter Date: 02/27/2017      PT End of Session - 02/27/17 1448    Visit Number 10   Date for PT Re-Evaluation 03/12/17   PT Start Time 8127   PT Stop Time 1521   PT Time Calculation (min) 38 min   Activity Tolerance Patient tolerated treatment well   Behavior During Therapy [facility] for tasks assessed/performed      History reviewed. No pertinent past medical history.  History reviewed. No pertinent surgical history.  There were no vitals filed for this visit.      Subjective Assessment - 02/27/17 1447    Subjective Neck is doing better, no pain.    Pertinent History Diabetes, HTN   Limitations House hold activities   How long can you sit comfortably? as long as I want   Diagnostic tests none   Patient Stated Goals less pain   Currently in Pain? No/denies   Pain Score 0-No pain                         [facility] Adult PT Treatment/Exercise - 02/27/17 0001      Therapeutic Activites    Other Therapeutic Activities Pulling, reaching, posture     Lumbar Exercises: Supine   Ab Set 10 reps  with ball   Bridge 10 reps   Bridge Limitations with red ball   Other Supine Lumbar Exercises lumbar rotation with LEs on red ball 15x     Knee/Hip Exercises: Aerobic   Nustep L2 10 min seat 4 UEs/LEs  PT present to discuss progress     Shoulder Exercises: Supine   Other Supine Exercises on foam roll red band horizontal abduction, diagonals, external rotation 2x10 each     Shoulder Exercises: Standing   Other Standing Exercises Snow angels on wall  x10   Other Standing Exercises wall push ups 20x     Shoulder Exercises: ROM/Strengthening   UBE (Upper Arm Bike) L2  3x3     Shoulder Exercises: Power Development worker, community 20 reps   Extension Limitations 25#   Row 10 reps  seated   Row Limitations 25#                  PT Short Term Goals - 02/27/17 1448      PT SHORT TERM GOAL #3   Title The patient will have improved cervical ROM with sidebending 40 degrees bilaterally and symmetrical rotation with minimal complaint of pain   Time 4   Period Weeks   Status Partially Met           PT Long Term Goals - 02/27/17 1448      PT LONG TERM GOAL #1   Title The patient will be independent in safe self progression of HEP    03/12/17   Time 8   Period Weeks   Status On-going     PT LONG TERM GOAL #2   Title The patient will have full shoulder ROM and strength for light lifting needed for home and work ADLS   Time 8   Period Weeks   Status On-going     PT LONG TERM GOAL #3   Title Pain in neck and  back with overall improvement at 60%   Status Achieved               Plan - 02/27/17 1515    Clinical Impression Statement Pt did well with all strengthening exericses. Continues to have ROM limitations due to pain and tightness. Pt reports neck has been doing well at work but low back contines to bother her with standing. Pt will continue to benefit from skilled thearpy for core strength and stability.    Rehab Potential Good   PT Frequency 2x / week   PT Duration 8 weeks   PT Treatment/Interventions ADLs/Self Care Home Management;Cryotherapy;Electrical Stimulation;Ultrasound;Traction;Moist Heat;Therapeutic activities;Therapeutic exercise;Neuromuscular re-education;Patient/family education;Manual techniques;Taping;Dry needling   PT Next Visit Plan  postural strengthening;  cervical rotation ROM;  scapular strengthening;  core strengthening   Consulted and Agree with Plan of Care Patient      Patient will benefit from skilled therapeutic intervention in order to improve the following deficits and impairments:  Pain, Increased muscle  spasms, Decreased range of motion, Postural dysfunction  Visit Diagnosis: Cervicalgia  Acute midline low back pain without sciatica  Abnormal posture     Problem List There are no active problems to display for this patient.   Mikle Bosworth PTA 02/27/2017, 3:22 PM PHYSICAL THERAPY DISCHARGE SUMMARY  Visits from Start of Care: 10  Current functional level related to goals / functional outcomes: See above for current status.  Pt didn't return to PT.   Remaining deficits: See above for most current status.   Education / Equipment: HEP, posture Plan: Patient agrees to discharge.  Patient goals were partially met. Patient is being discharged due to not returning since the last visit.  ?????        Sigurd Sos, PT 01/07/18 9:03 AM  [facility] [address], [place], [place], [address] Phone: [phone]   Fax:  [phone]  Name: Carol Mercer MRN: [id number] Date of Birth: 1951-09-22

## 2017-02-28 ENCOUNTER — Ambulatory Visit: Payer: BLUE CROSS/BLUE SHIELD | Admitting: Physical Therapy

## 2017-03-05 ENCOUNTER — Other Ambulatory Visit: Payer: Self-pay | Admitting: Family Medicine

## 2017-03-05 DIAGNOSIS — Z1231 Encounter for screening mammogram for malignant neoplasm of breast: Secondary | ICD-10-CM

## 2017-03-06 ENCOUNTER — Other Ambulatory Visit (HOSPITAL_COMMUNITY)
Admission: RE | Admit: 2017-03-06 | Discharge: 2017-03-06 | Disposition: A | Discharge status: Home / Self Care | Payer: BLUE CROSS/BLUE SHIELD | Source: Ambulatory Visit | Attending: Family Medicine | Admitting: Family Medicine

## 2017-03-06 ENCOUNTER — Other Ambulatory Visit: Payer: Self-pay | Admitting: Family Medicine

## 2017-03-06 DIAGNOSIS — Z0000 Encounter for general adult medical examination without abnormal findings: Secondary | ICD-10-CM | POA: Diagnosis not present

## 2017-03-06 DIAGNOSIS — E559 Vitamin D deficiency, unspecified: Secondary | ICD-10-CM | POA: Diagnosis not present

## 2017-03-06 DIAGNOSIS — Z124 Encounter for screening for malignant neoplasm of cervix: Secondary | ICD-10-CM | POA: Insufficient documentation

## 2017-03-06 DIAGNOSIS — E7800 Pure hypercholesterolemia, unspecified: Secondary | ICD-10-CM | POA: Diagnosis not present

## 2017-03-06 DIAGNOSIS — E118 Type 2 diabetes mellitus with unspecified complications: Secondary | ICD-10-CM | POA: Diagnosis not present

## 2017-03-07 ENCOUNTER — Other Ambulatory Visit: Payer: Self-pay | Admitting: Family Medicine

## 2017-03-07 DIAGNOSIS — Z8673 Personal history of transient ischemic attack (TIA), and cerebral infarction without residual deficits: Secondary | ICD-10-CM

## 2017-03-11 LAB — CYTOLOGY - PAP
ADEQUACY: ABSENT
DIAGNOSIS: NEGATIVE
HPV: DETECTED — AB

## 2017-03-18 DIAGNOSIS — R809 Proteinuria, unspecified: Secondary | ICD-10-CM | POA: Diagnosis not present

## 2017-03-29 ENCOUNTER — Encounter: Payer: Self-pay | Admitting: Obstetrics & Gynecology

## 2017-03-29 ENCOUNTER — Ambulatory Visit (INDEPENDENT_AMBULATORY_CARE_PROVIDER_SITE_OTHER): Payer: BLUE CROSS/BLUE SHIELD | Admitting: Obstetrics & Gynecology

## 2017-03-29 VITALS — BP 130/84 | Ht 58.5 in | Wt 132.0 lb

## 2017-03-29 DIAGNOSIS — B977 Papillomavirus as the cause of diseases classified elsewhere: Secondary | ICD-10-CM | POA: Diagnosis not present

## 2017-03-29 DIAGNOSIS — R87616 Satisfactory cervical smear but lacking transformation zone: Secondary | ICD-10-CM | POA: Diagnosis not present

## 2017-03-29 DIAGNOSIS — N904 Leukoplakia of vulva: Secondary | ICD-10-CM | POA: Diagnosis not present

## 2017-03-29 DIAGNOSIS — N870 Mild cervical dysplasia: Secondary | ICD-10-CM | POA: Diagnosis not present

## 2017-03-29 DIAGNOSIS — Z113 Encounter for screening for infections with a predominantly sexual mode of transmission: Secondary | ICD-10-CM | POA: Diagnosis not present

## 2017-03-29 MED ORDER — CLOBETASOL PROPIONATE 0.05 % EX OINT: 1.0000 "application " | TOPICAL_OINTMENT | CUTANEOUS | 3 refills | Status: DC

## 2017-03-29 NOTE — Addendum Note (Signed)
Addended by: Berna SpareASTILLO, Suzann Lazaro A on: 03/29/2017 03:16 PM   Modules accepted: Orders

## 2017-03-29 NOTE — Progress Notes (Signed)
    Carol Mercer 10-24-1951 [id number]        66 y.o.  G3P3L2  Married  RP:  HPV HR pos/Pap neg, but TZ absent  HPI:  No previous abnormal Pap or HPV per patient.  Menopause.  No HRT.  No PMB.  No pelvic pain.  C/O frequent vulvar itching.  Past medical history,surgical history, problem list, medications, allergies, family history and social history were all reviewed and documented in the EPIC chart.  Directed ROS with pertinent positives and negatives documented in the history of present illness/assessment and plan.  Exam:  Vitals:   03/29/17 1404  Weight: 132 lb (59.9 kg)  Height: 4' 10.5" (1.486 m)   General appearance:  Normal  Colposcopy Procedure Note Carol Mercer 03/29/2017  Indications:  HPV HR pos.  Pap neg, but TZ absent  Procedure Details   The risks and benefits of the procedure and Verbal informed consent obtained.  Speculum placed in vagina and excellent visualization of cervix achieved, cervix swabbed x 3 with acetic acid solution.  Findings:  Cervix colposcopy:  Physical Exam  Genitourinary:      Vaginal colposcopy:  Normal  Vulvar colposcopy:  Normal except typical changes associated with Lichen sclerosus.  Perirectal colposcopy:  Not done, grossly normal   Specimens: Cervical Bx at 2 O'clock, HPV 16-18-45, Gono-Chlam.  Complications:  None . Plan:  Awaiting results.  Management per results.  Assessment/Plan:  66 y.o. G3P3   1. 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  Colposcopy of cervix, vagina, vulva done today.  Bx taken.  HPV 16-18-45 done.  Gono-Chlam done. Management per results.  2. Transformation zone absent on cervical Pap smear Decision therefore taken to proceed with Colposcopy today.  3. Lichen sclerosus of female genitalia Typical butterfly white thinning of the vulva with itching.  Clobetasol 0.05% ointment, thin layer twice a week on affected areas of vulva.  Counseling on above issues >50% x 20  minutes  Genia DelMarie-Lyne Hezakiah Champeau MD, 2:13 PM 03/29/2017

## 2017-03-29 NOTE — Patient Instructions (Signed)
1. 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  Colposcopy of cervix, vagina, vulva done today.  Bx taken.  HPV 16-18-45 done.  Gono-Chlam done. Management per results.  2. Transformation zone absent on cervical Pap smear Decision therefore taken to proceed with Colposcopy today.  3. Lichen sclerosus of female genitalia Typical butterfly white thinning of the vulva with itching.  Clobetasol 0.05% ointment, thin layer twice a week on affected areas of vulva.  Carol Mercer, it was a pleasure to meet you today!  I will inform you of the results and plan as soon as available.

## 2017-03-30 LAB — GC/CHLAMYDIA PROBE AMP
CT Probe RNA: NOT DETECTED
GC PROBE AMP APTIMA: NOT DETECTED

## 2017-04-02 LAB — PATHOLOGY

## 2017-04-03 LAB — HPV TYPE 16 AND 18/45 RNA
HPV TYPE 16 RNA: NOT DETECTED
HPV TYPE 18/45 RNA: NOT DETECTED

## 2017-04-08 ENCOUNTER — Ambulatory Visit: Payer: BLUE CROSS/BLUE SHIELD

## 2017-04-09 ENCOUNTER — Ambulatory Visit
Admission: RE | Admit: 2017-04-09 | Discharge: 2017-04-09 | Disposition: A | Discharge status: Home / Self Care | Payer: BLUE CROSS/BLUE SHIELD | Source: Ambulatory Visit | Attending: Family Medicine | Admitting: Family Medicine

## 2017-04-09 DIAGNOSIS — Z1231 Encounter for screening mammogram for malignant neoplasm of breast: Secondary | ICD-10-CM | POA: Diagnosis not present

## 2017-07-13 DIAGNOSIS — L439 Lichen planus, unspecified: Secondary | ICD-10-CM | POA: Diagnosis not present

## 2017-07-17 DIAGNOSIS — L03311 Cellulitis of abdominal wall: Secondary | ICD-10-CM | POA: Diagnosis not present

## 2017-07-17 DIAGNOSIS — L304 Erythema intertrigo: Secondary | ICD-10-CM | POA: Diagnosis not present

## 2017-07-18 DIAGNOSIS — L304 Erythema intertrigo: Secondary | ICD-10-CM | POA: Diagnosis not present

## 2017-07-18 DIAGNOSIS — L03311 Cellulitis of abdominal wall: Secondary | ICD-10-CM | POA: Diagnosis not present

## 2017-07-24 DIAGNOSIS — E118 Type 2 diabetes mellitus with unspecified complications: Secondary | ICD-10-CM | POA: Diagnosis not present

## 2017-07-24 DIAGNOSIS — L304 Erythema intertrigo: Secondary | ICD-10-CM | POA: Diagnosis not present

## 2017-07-24 DIAGNOSIS — L03311 Cellulitis of abdominal wall: Secondary | ICD-10-CM | POA: Diagnosis not present

## 2017-07-24 DIAGNOSIS — N289 Disorder of kidney and ureter, unspecified: Secondary | ICD-10-CM | POA: Diagnosis not present

## 2017-07-24 DIAGNOSIS — Z23 Encounter for immunization: Secondary | ICD-10-CM | POA: Diagnosis not present

## 2017-08-29 ENCOUNTER — Ambulatory Visit: Payer: BLUE CROSS/BLUE SHIELD | Admitting: Obstetrics & Gynecology

## 2017-10-21 ENCOUNTER — Ambulatory Visit: Payer: BLUE CROSS/BLUE SHIELD | Admitting: Obstetrics & Gynecology

## 2017-10-30 ENCOUNTER — Ambulatory Visit (INDEPENDENT_AMBULATORY_CARE_PROVIDER_SITE_OTHER): Payer: BLUE CROSS/BLUE SHIELD | Admitting: Obstetrics & Gynecology

## 2017-10-30 ENCOUNTER — Encounter: Payer: Self-pay | Admitting: Obstetrics & Gynecology

## 2017-10-30 VITALS — BP 138/86

## 2017-10-30 DIAGNOSIS — Z1151 Encounter for screening for human papillomavirus (HPV): Secondary | ICD-10-CM

## 2017-10-30 DIAGNOSIS — R87810 Cervical high risk human papillomavirus (HPV) DNA test positive: Secondary | ICD-10-CM

## 2017-10-30 DIAGNOSIS — N904 Leukoplakia of vulva: Secondary | ICD-10-CM | POA: Diagnosis not present

## 2017-10-30 DIAGNOSIS — N870 Mild cervical dysplasia: Secondary | ICD-10-CM

## 2017-10-30 NOTE — Progress Notes (Signed)
    Carol Mercer 6/1[name]5/1952 [id number]        66 y.o.  G3P3 Married  RP:  CIN 1 03/2017 for f/u Pap test  HPI:  Last visit 03/29/2017 Colposcopy showed CIN1.  HPV HR pos, but HPV 16-18-45 neg.  Gono-Chlam neg.    Past medical history,surgical history, problem list, medications, allergies, family history and social history were all reviewed and documented in the EPIC chart.  Directed ROS with pertinent positives and negatives documented in the history of present illness/assessment and plan.  Exam:  Vitals:   10/30/17 1427  BP: 138/86   General appearance:  Normal  Gynecologic exam: Vulva with white atrophy of the Lichen Sclerosis.  Speculum: Cervix and vagina normal.  Pap with high risk HPV done.  Vaginal secretions normal.    Assessment/Plan:  66 y.o. G3P3   1. 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  Repeat Pap with high-risk HPV done today.  2. Cervical high risk HPV (human papillomavirus) test positive High risk HPV positive, but HPV 16-18- 45.  Recommend Folic Acid supplement daily.  Nutrition rich in green vegetables.  3. Lichen sclerosus et atrophicus of the vulva Recommend continuing with clobetasol ointment twice a week.  Counseling on above issues more than 50% for 15 minutes.  Carol [name] MD, 2:29 PM 10/30/2017

## 2017-10-30 NOTE — Patient Instructions (Signed)
1. 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  Repeat Pap with high-risk HPV done today.  2. Cervical high risk HPV (human papillomavirus) test positive High risk HPV positive, but HPV 16-18- 45.  Recommend Folic Acid supplement daily.  Nutrition rich in green vegetables.  3. Lichen sclerosus et atrophicus of the vulva Recommend continuing with clobetasol ointment twice a week.  Tangie, fue un placer verla de nuevo hoy!  Voy a informarla de sus CDW Corporationresultados muy pronto.

## 2017-10-30 NOTE — Addendum Note (Signed)
Addended by: Berna SpareASTILLO, BLANCA A on: 10/30/2017 03:10 PM   Modules accepted: Orders

## 2017-11-07 LAB — PAP, TP IMAGING W/ HPV RNA, RFLX HPV TYPE 16,18/45: HPV DNA HIGH RISK: DETECTED — AB

## 2017-11-07 LAB — HPV TYPE 16 AND 18/45 RNA
HPV Type 16 RNA: DETECTED — AB
HPV Type 18/45 RNA: NOT DETECTED

## 2017-12-16 ENCOUNTER — Ambulatory Visit: Payer: BLUE CROSS/BLUE SHIELD | Admitting: Obstetrics & Gynecology

## 2017-12-16 ENCOUNTER — Encounter: Payer: Self-pay | Admitting: Obstetrics & Gynecology

## 2017-12-16 VITALS — BP 146/88

## 2017-12-16 DIAGNOSIS — N904 Leukoplakia of vulva: Secondary | ICD-10-CM | POA: Diagnosis not present

## 2017-12-16 DIAGNOSIS — R87810 Cervical high risk human papillomavirus (HPV) DNA test positive: Secondary | ICD-10-CM

## 2017-12-16 DIAGNOSIS — N870 Mild cervical dysplasia: Secondary | ICD-10-CM

## 2017-12-16 MED ORDER — CLOBETASOL PROPIONATE 0.05 % EX OINT: 1.0000 "application " | TOPICAL_OINTMENT | CUTANEOUS | 3 refills | Status: DC

## 2017-12-16 NOTE — Progress Notes (Signed)
    Carol Mercer 08/23/1951 [id number]        67 y.o.  G3P3   RP: H/O CIN 1 with HPV 16 positive for Colposcopy  HPI: Last Pap test negative, but HPV HR pos, HPV 16 pos.  CIN 1 03/2017.   OB History  Gravida Para Term Preterm AB Living  3 3       3   SAB TAB Ectopic Multiple Live Births               # Outcome Date GA Lbr Len/2nd Weight Sex Delivery Anes PTL Lv  3 Para           2 Para           1 Para               Past medical history,surgical history, problem list, medications, allergies, family history and social history were all reviewed and documented in the EPIC chart.   Directed ROS with pertinent positives and negatives documented in the history of present illness/assessment and plan.  Exam:  Vitals:   12/16/17 1421  BP: (!) 146/88   General appearance:  Normal  Colposcopy Procedure Note Carol Mercer 12/16/2017  Indications:  H/O CIN1, HPV 16 positive.  Last Pap negative  Procedure Details  The risks and benefits of the procedure and Verbal informed consent obtained.  Speculum placed in vagina and excellent visualization of cervix achieved, cervix swabbed x 3 with acetic acid solution.  Findings:  Cervix colposcopy:  Physical Exam  Genitourinary:      Vaginal colposcopy:  Normal  Vulvar colposcopy: Grossly normal  Perirectal colposcopy: Grossly normal  The cervix was sprayed with Hurricane before performing the cervical biopsies.  Specimens: Bxs of cervix at 6 and 1 O'clock  Complications:  None, hemostasis with Silver Nitrate . Plan:  Pending Bxs, management per results   Assessment/Plan:  67 y.o. G3P3   1. 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  Last Pap test negative.  History of CIN-1.  Colposcopy today is reassuring, possible CIN-1 maximum.  Cervical biopsies pending.  Management per results.  Findings discussed with patient.  2. Mild cervical dysplasia, histologically confirmed As  above.  3. Lichen sclerosus et atrophicus of the vulva Doing well on clobetasol ointment.  Re-prescription sent.  Other orders - Folic Acid 5 MG CAPS; Take by mouth. - clobetasol ointment (TEMOVATE) 0.05 %; Apply 1 application topically 2 (two) times a week. Thin layer on vulva - Pathology Report  Carol DelMarie-Lyne Kaion Tisdale MD, 2:40 PM 12/16/2017

## 2017-12-16 NOTE — Patient Instructions (Signed)
1. 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  Last Pap test negative.  History of CIN-1.  Colposcopy today is reassuring, possible CIN-1 maximum.  Cervical biopsies pending.  Management per results.  Findings discussed with patient.  2. Mild cervical dysplasia, histologically confirmed As above.  3. Lichen sclerosus et atrophicus of the vulva Doing well on clobetasol ointment.  Re-prescription sent.  Other orders - Folic Acid 5 MG CAPS; Take by mouth. - clobetasol ointment (TEMOVATE) 0.05 %; Apply 1 application topically 2 (two) times a week. Thin layer on vulva - Pathology Report  Jamica, fue un placer verle hoy!  Voy a informarle de sus CDW Corporationresultados muy pronto.  Colposcopa - Cuidados posteriores (Colposcopy, Care After) Siga estas instrucciones durante las prximas semanas. Estas indicaciones le proporcionan informacin general acerca de cmo deber cuidarse despus del procedimiento. El mdico tambin podr darle instrucciones ms especficas. El tratamiento se ha planificado de acuerdo a las prcticas mdicas actuales, pero a veces se producen problemas. Comunquese con el mdico si tiene algn problema o tiene dudas despus del procedimiento. QU ESPERAR DESPUS DEL PROCEDIMIENTO  Despus del procedimiento, es tpico tener las siguientes sensaciones:  Clicos. Generalmente se calman en algunos minutos.  Dolor. Puede durar [facility] dos das.  Aturdimiento. Si esto le ocurre, recustese durante algunos minutos. Podr tener un sangrado leve o una secrecin oscura que debe detenerse en [place]os das. Durante este tiempo deber usar un apsito sanitario. INSTRUCCIONES PARA EL CUIDADO EN EL HOGAR  Evite las [address] Nerelaciones sexuales, las duchas vaginales y el uso de tampones durante [address]  [address]das, o segn lo que le indique su mdico.  Tome slo medicamentos de venta libre o recetados, segn las indicaciones del mdico. No tome aspirina, ya que puede causar  hemorragias.  Si utiliza pldoras anticonceptivas, contine tomndolas.  No todos los resultados estarn disponibles durante su visita. En este caso, tenga otra entrevista con su mdico para conocerlos. No suponga que es normal si no tiene noticias de su mdico o del establecimiento de salud. Es Copyimportante el seguimiento de todos los [place]resultados de [place] estudios.  Siga los consejos de su mdico con respecto a los [place]medicamentos, [place] Campactividades, visitas y [place] de control. SOLICITE ATENCIN MDICA SI:  Aparece una erupcin cutnea.  Tiene problemas con los medicamentos. SOLICITE ATENCIN MDICA DE INMEDIATO SI:  Tiene una hemorragia abundante o elimina cogulos.  Tiene fiebre.  Tiene flujo vaginal anormal.  Tiene clicos que no se alivian luego de tomar analgsicos.  Se siente mareada, tiene vahdos o se desmaya.  Siente Physiological scientistdolor en el estmago. Esta informacin no tiene Theme park managercomo fin reemplazar el consejo del mdico. Asegrese de hacerle al mdico cualquier pregunta que tenga. Document Released: 08/12/2013 Elsevier Interactive Patient Education  2017 [employer]

## 2017-12-18 LAB — TISSUE PATH REPORT

## 2017-12-18 LAB — PATHOLOGY

## 2018-03-12 DIAGNOSIS — Z0000 Encounter for general adult medical examination without abnormal findings: Secondary | ICD-10-CM | POA: Diagnosis not present

## 2018-03-12 DIAGNOSIS — E119 Type 2 diabetes mellitus without complications: Secondary | ICD-10-CM | POA: Diagnosis not present

## 2018-03-12 DIAGNOSIS — R8290 Unspecified abnormal findings in urine: Secondary | ICD-10-CM | POA: Diagnosis not present

## 2018-03-12 DIAGNOSIS — Z23 Encounter for immunization: Secondary | ICD-10-CM | POA: Diagnosis not present

## 2018-03-12 DIAGNOSIS — I10 Essential (primary) hypertension: Secondary | ICD-10-CM | POA: Diagnosis not present

## 2018-04-02 ENCOUNTER — Encounter: Payer: BLUE CROSS/BLUE SHIELD | Admitting: Obstetrics & Gynecology

## 2018-04-08 ENCOUNTER — Other Ambulatory Visit: Payer: Self-pay | Admitting: Family Medicine

## 2018-04-08 DIAGNOSIS — Z1231 Encounter for screening mammogram for malignant neoplasm of breast: Secondary | ICD-10-CM

## 2018-04-08 IMAGING — MG 2D DIGITAL SCREENING BILATERAL MAMMOGRAM WITH CAD AND ADJUNCT TO
9 of 13 series · 9 of 29 positions shown · non-contrast
Comparison: Previous exam(s).

CLINICAL DATA: Screening.

EXAM:
2D DIGITAL SCREENING BILATERAL MAMMOGRAM WITH CAD AND ADJUNCT TOMO

[L MLO (1 of 2)]
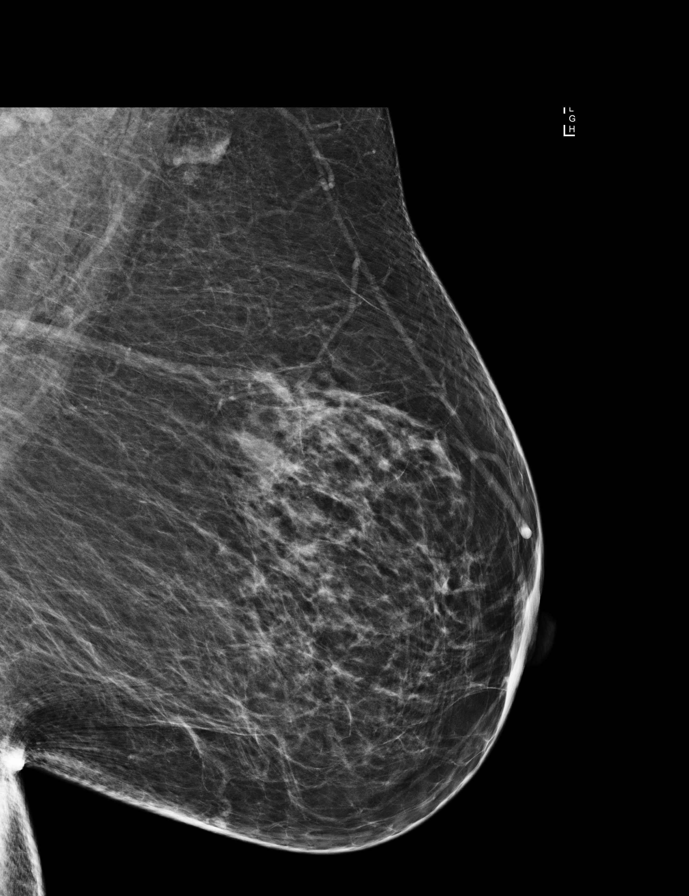

[R MLO synth-2D]
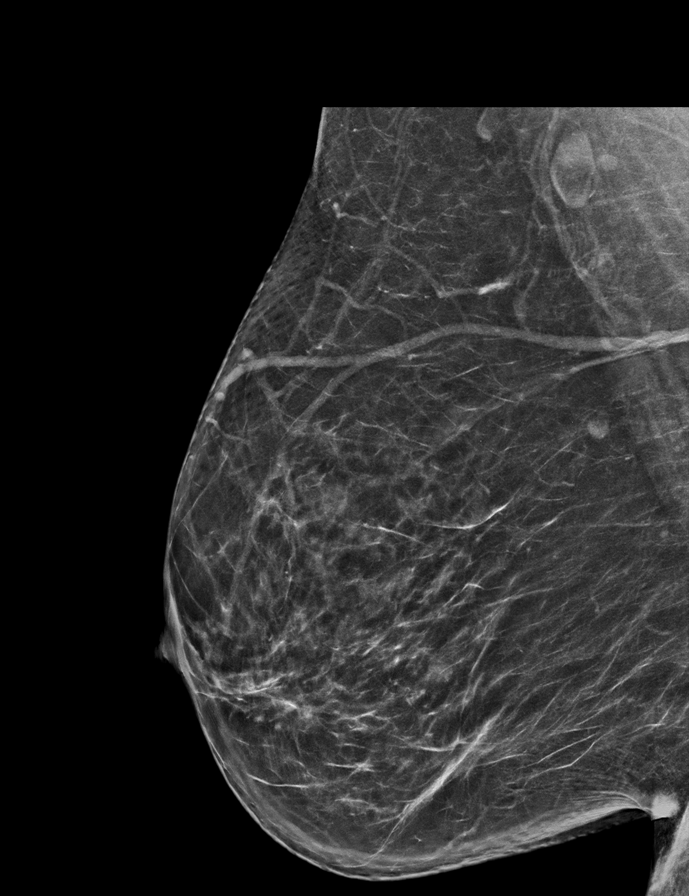

[R CC]
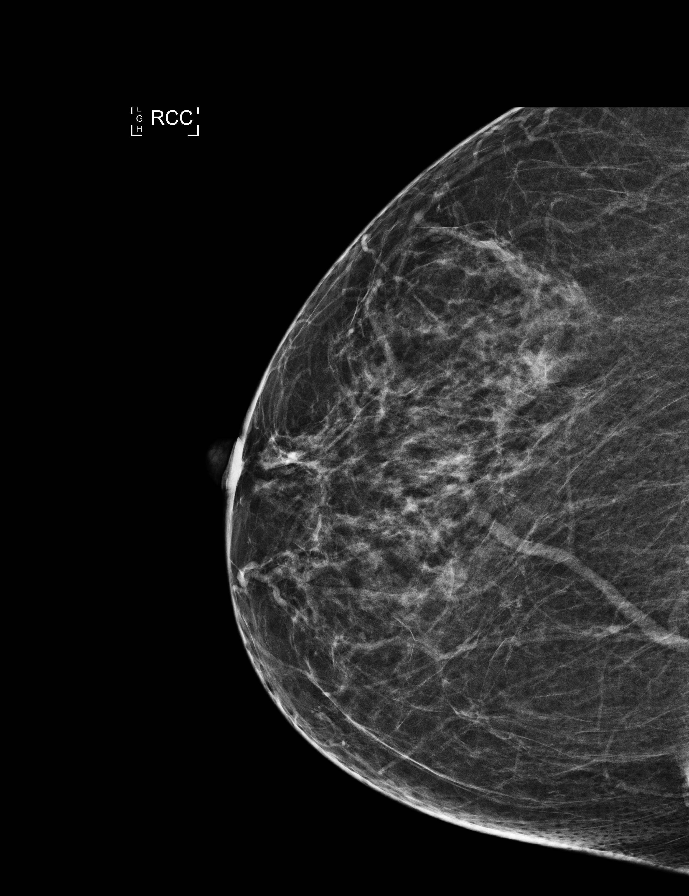

[L MLO (2 of 2)]
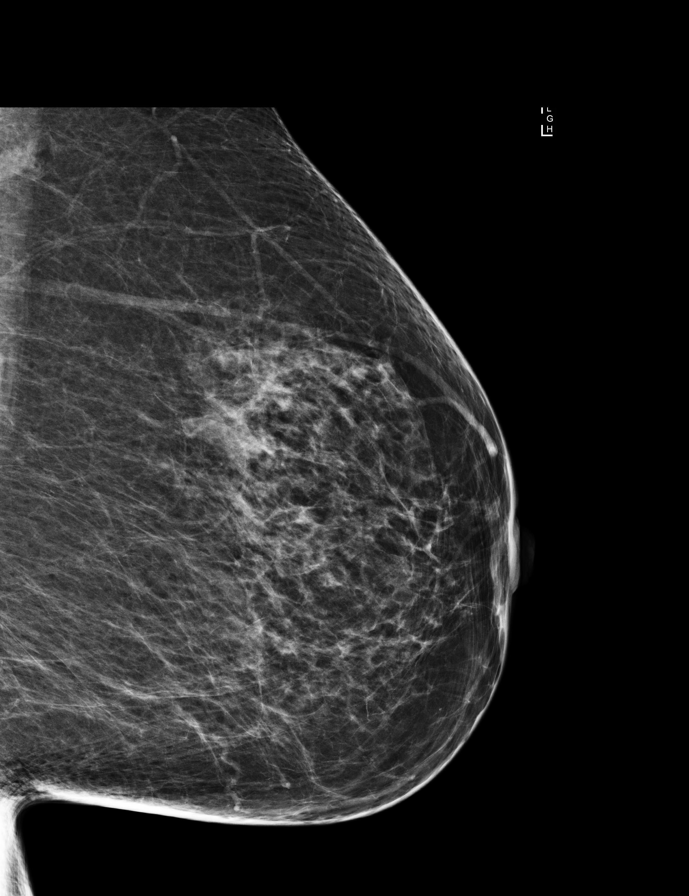

[L MLO synth-2D]
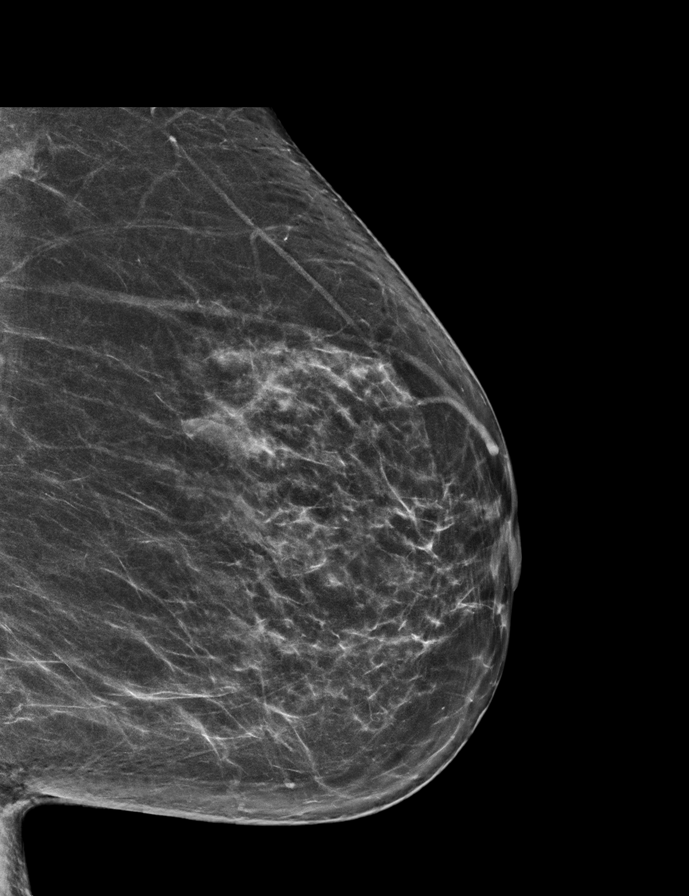

[L CC synth-2D]
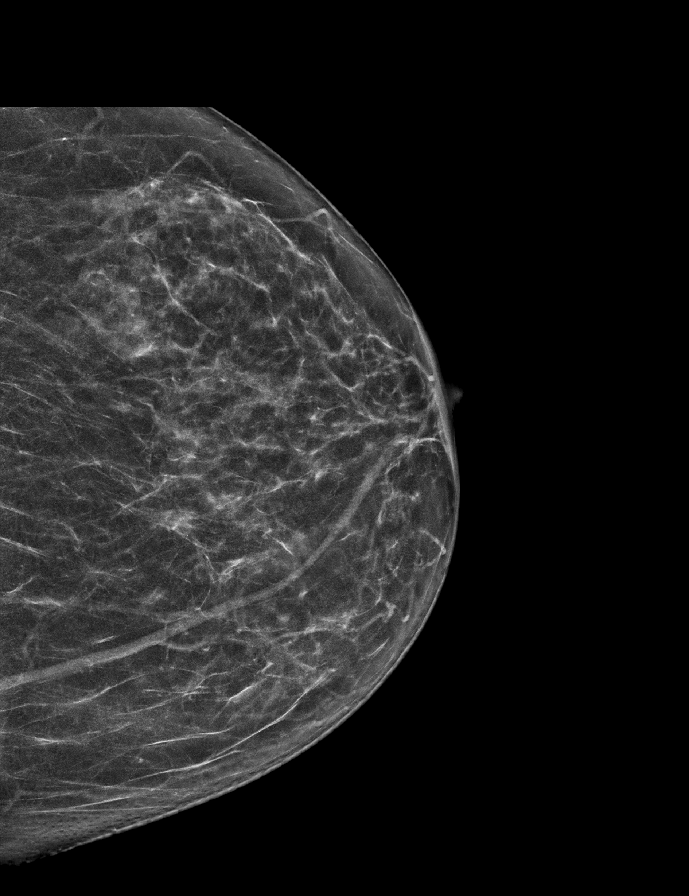

[R CC synth-2D]
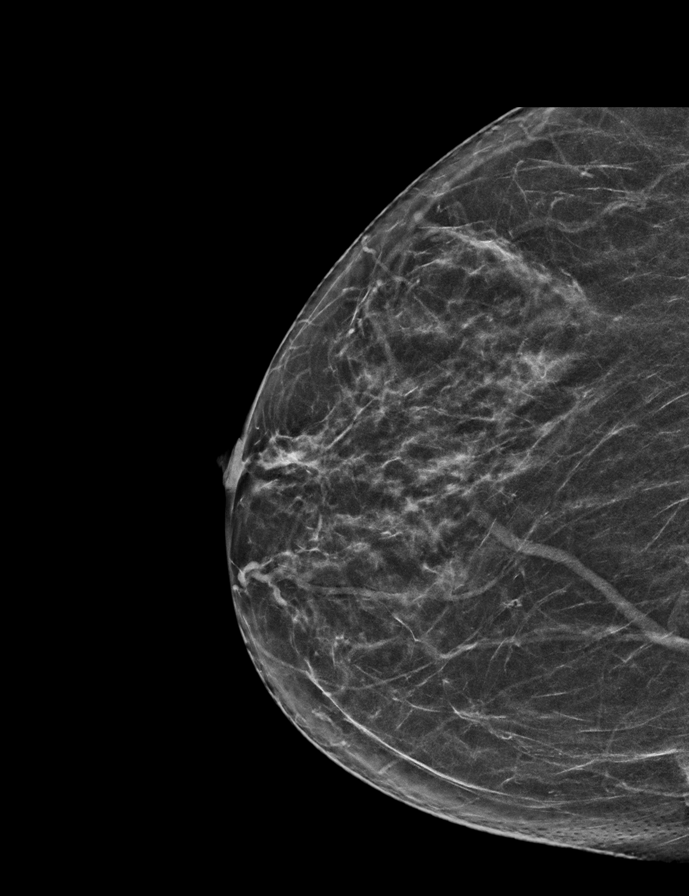

[R MLO]
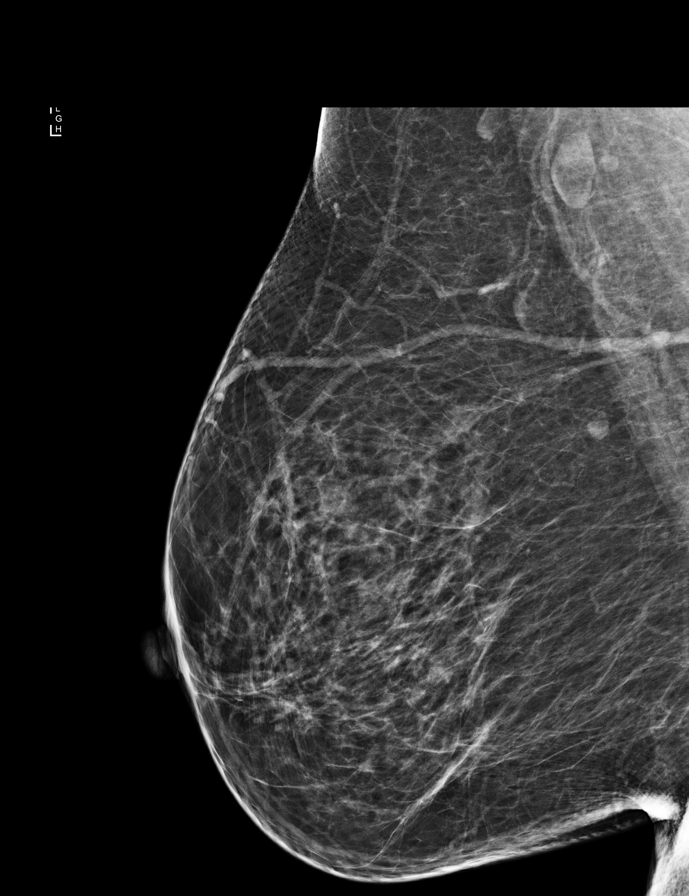

[L CC]
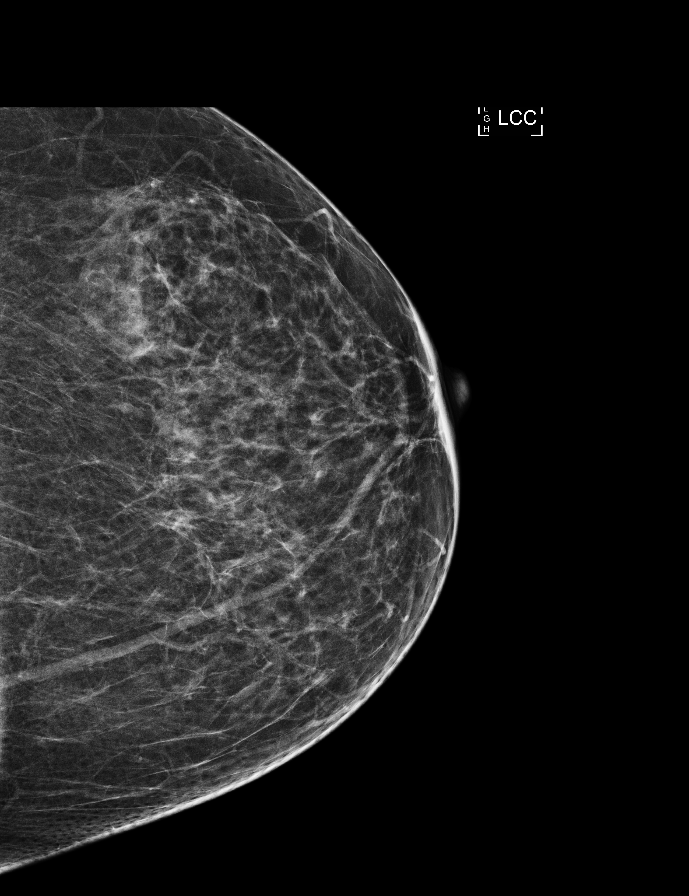

[9 of 29 positions shown; findings below may reference images not displayed]

ACR Breast Density Category b: There are scattered areas of
fibroglandular density.
FINDINGS: There are no findings suspicious for malignancy. Images were
processed with CAD.
IMPRESSION: No mammographic evidence of malignancy. A result letter of this
screening mammogram will be mailed directly to the patient.

RECOMMENDATION:
Screening mammogram in one year. (Code:97-6-RS4)

BI-RADS CATEGORY  1: Negative.

## 2018-04-29 ENCOUNTER — Ambulatory Visit: Payer: BLUE CROSS/BLUE SHIELD

## 2018-04-30 ENCOUNTER — Ambulatory Visit
Admission: RE | Admit: 2018-04-30 | Discharge: 2018-04-30 | Disposition: A | Discharge status: Home / Self Care | Payer: BLUE CROSS/BLUE SHIELD | Source: Ambulatory Visit | Attending: Family Medicine | Admitting: Family Medicine

## 2018-04-30 DIAGNOSIS — Z1231 Encounter for screening mammogram for malignant neoplasm of breast: Secondary | ICD-10-CM

## 2018-09-23 ENCOUNTER — Ambulatory Visit: Payer: Medicare Other | Admitting: Obstetrics & Gynecology

## 2018-09-23 ENCOUNTER — Encounter: Payer: Self-pay | Admitting: Obstetrics & Gynecology

## 2018-09-23 VITALS — BP 116/74 | Ht 58.5 in | Wt 140.0 lb

## 2018-09-23 DIAGNOSIS — Z780 Asymptomatic menopausal state: Secondary | ICD-10-CM

## 2018-09-23 DIAGNOSIS — Z01419 Encounter for gynecological examination (general) (routine) without abnormal findings: Secondary | ICD-10-CM

## 2018-09-23 DIAGNOSIS — N870 Mild cervical dysplasia: Secondary | ICD-10-CM

## 2018-09-23 DIAGNOSIS — Z13820 Encounter for screening for osteoporosis: Secondary | ICD-10-CM

## 2018-09-23 DIAGNOSIS — Z01411 Encounter for gynecological examination (general) (routine) with abnormal findings: Secondary | ICD-10-CM

## 2018-09-23 NOTE — Progress Notes (Signed)
Taia Bramlett [birth year] [id number]   History:    67 y.o. G3P3L3 Married  RP:  Established patient presenting for annual gyn exam   HPI: Postmenopause, well on no HRT.  No PMB.  No pelvic pain.  No pain with IC.  Urine/BMs wnl.  Breasts normal.  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  BMI 28.76.  Work is very physical.  Health labs with Fam MD, will check with Fam MD when due for Colonoscopy.  Past medical history,surgical history, family history and social history were all reviewed and documented in the EPIC chart.  Gynecologic History No LMP recorded. Patient is postmenopausal. Contraception: post menopausal status Last Pap: 10/2017. Results were: Negative, but HPV HR positive.  CIN 1 in 12/2017, HPV 16 positive Last mammogram: 04/2018. Results were: Negative Bone Density: Many years ago.  Will schedule here now Colonoscopy: Will check with Fam MD when due for repeat screening colono  Obstetric History OB History  Gravida Para Term Preterm AB Living  3 3       3   SAB TAB Ectopic Multiple Live Births               # Outcome Date GA Lbr Len/2nd Weight Sex Delivery Anes PTL Lv  3 Para           2 Para           1 Para              ROS: A ROS was performed and pertinent positives and negatives are included in the history.  GENERAL: No fevers or chills. HEENT: No change in vision, no earache, sore throat or sinus congestion. NECK: No pain or stiffness. CARDIOVASCULAR: No chest pain or pressure. No palpitations. PULMONARY: No shortness of breath, cough or wheeze. GASTROINTESTINAL: No abdominal pain, nausea, vomiting or diarrhea, melena or bright red blood per rectum. GENITOURINARY: No urinary frequency, urgency, hesitancy or dysuria. MUSCULOSKELETAL: No joint or muscle pain, no back pain, no recent trauma. DERMATOLOGIC: No rash, no itching, no lesions. ENDOCRINE: No polyuria, polydipsia, no heat or cold intolerance. No recent change in weight. HEMATOLOGICAL: No anemia or easy  bruising or bleeding. NEUROLOGIC: No headache, seizures, numbness, tingling or weakness. PSYCHIATRIC: No depression, no loss of interest in normal activity or change in sleep pattern.     Exam:   BP 116/74   Ht 4' 10.5" (1.486 m)   Wt 140 lb (63.5 kg)   BMI 28.76 kg/m   Body mass index is 28.76 kg/m.  General appearance : Well developed well nourished female. No acute distress HEENT: Eyes: no retinal hemorrhage or exudates,  Neck supple, trachea midline, no carotid bruits, no thyroidmegaly Lungs: Clear to auscultation, no rhonchi or wheezes, or rib retractions  Heart: Regular rate and rhythm, no murmurs or gallops Breast:Examined in sitting and supine position were symmetrical in appearance, no palpable masses or tenderness,  no skin retraction, no nipple inversion, no nipple discharge, no skin discoloration, no axillary or supraclavicular lymphadenopathy Abdomen: no palpable masses or tenderness, no rebound or guarding Extremities: no edema or skin discoloration or tenderness  Pelvic: Vulva: Normal             Vagina: No gross lesions or discharge  Cervix: No gross lesions or discharge.  Pap/HPV HR done  Uterus  AV, normal size, shape and consistency, non-tender and mobile  Adnexa  Without masses or tenderness  Anus: Normal   Assessment/Plan:  67 y.o. female for  annual exam   1. Encounter for routine gynecological examination with Papanicolaou smear of cervix Normal gynecologic exam.  Pap with high-risk HPV done today.  Breast exam normal.  Last mammogram June 2019 was negative.  Body mass index 28.76.  Mild decrease in calories/carbs in diet with aerobic physical activities 5 times a week and weightlifting every 2 days.  2. 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  Last Pap test negative in December 2018, but high risk HPV still positive.  Pap with high-risk HPV done today.  3. Postmenopausal Postmenopausal, well on no hormone  replacement therapy.  No postmenopausal bleeding.  4. Screening for osteoporosis Will schedule bone density here today.  Vitamin D supplements, calcium intake of 1.5 g/day and regular weightbearing physical activity recommended. - DG Bone Density; Future  Genia DelMarie-Lyne Sylas Twombly MD, 2:53 PM 09/23/2018

## 2018-09-25 LAB — PAP IG W/ RFLX HPV ASCU

## 2018-09-29 ENCOUNTER — Telehealth: Payer: Self-pay

## 2018-09-29 ENCOUNTER — Encounter: Payer: Self-pay | Admitting: Obstetrics & Gynecology

## 2018-09-29 NOTE — Telephone Encounter (Signed)
-----   Message from Genia DelMarie-Lyne Lavoie, MD sent at 09/26/2018  3:44 PM EST ----- Pap negative

## 2018-09-29 NOTE — Telephone Encounter (Signed)
Patient was informed. She asked when she needs to return for follow up Pap. Colpo 03/2017 and 12/16/17.

## 2018-09-29 NOTE — Patient Instructions (Signed)
1. Encounter for routine gynecological examination with Papanicolaou smear of cervix Normal gynecologic exam.  Pap with high-risk HPV done today.  Breast exam normal.  Last mammogram June 2019 was negative.  Body mass index 28.76.  Mild decrease in calories/carbs in diet with aerobic physical activities 5 times a week and weightlifting every 2 days.  2. 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  Last Pap test negative in December 2018, but high risk HPV still positive.  Pap with high-risk HPV done today.  3. Postmenopausal Postmenopausal, well on no hormone replacement therapy.  No postmenopausal bleeding.  4. Screening for osteoporosis Will schedule bone density here today.  Vitamin D supplements, calcium intake of 1.5 g/day and regular weightbearing physical activity recommended. - DG Bone Density; Future  Tersea, fue un placer verle hoy!  Voy a informarle de sus [employer]resultados muy pronto.

## 2018-09-29 NOTE — Telephone Encounter (Signed)
Will do a 6 month Pap.

## 2018-09-29 NOTE — Telephone Encounter (Signed)
Left message in voice mail. Recall placed.

## 2018-12-10 ENCOUNTER — Ambulatory Visit (INDEPENDENT_AMBULATORY_CARE_PROVIDER_SITE_OTHER): Payer: Medicare Other

## 2018-12-10 DIAGNOSIS — Z780 Asymptomatic menopausal state: Secondary | ICD-10-CM | POA: Diagnosis not present

## 2018-12-10 DIAGNOSIS — Z13820 Encounter for screening for osteoporosis: Secondary | ICD-10-CM

## 2018-12-11 ENCOUNTER — Other Ambulatory Visit: Payer: Self-pay | Admitting: Obstetrics & Gynecology

## 2018-12-11 DIAGNOSIS — Z780 Asymptomatic menopausal state: Secondary | ICD-10-CM

## 2019-03-16 ENCOUNTER — Other Ambulatory Visit: Payer: Self-pay

## 2019-03-17 ENCOUNTER — Encounter: Payer: Self-pay | Admitting: Obstetrics & Gynecology

## 2019-03-17 ENCOUNTER — Ambulatory Visit: Payer: Medicare Other | Admitting: Obstetrics & Gynecology

## 2019-03-17 VITALS — BP 122/76

## 2019-03-17 DIAGNOSIS — R87810 Cervical high risk human papillomavirus (HPV) DNA test positive: Secondary | ICD-10-CM | POA: Diagnosis not present

## 2019-03-17 DIAGNOSIS — N870 Mild cervical dysplasia: Secondary | ICD-10-CM | POA: Diagnosis not present

## 2019-03-17 DIAGNOSIS — N904 Leukoplakia of vulva: Secondary | ICD-10-CM | POA: Diagnosis not present

## 2019-03-17 MED ORDER — CLOBETASOL PROPIONATE 0.05 % EX OINT: 1.0000 "application " | TOPICAL_OINTMENT | CUTANEOUS | 3 refills | Status: AC

## 2019-03-17 NOTE — Addendum Note (Signed)
Addended by: Dayna Barker on: 03/17/2019 02:46 PM   Modules accepted: Orders

## 2019-03-17 NOTE — Patient Instructions (Signed)
1. 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  Repeat Pap test November 2019 was negative.  Repeat Pap test with high-risk HPV done today.  2. Cervical high risk HPV (human papillomavirus) test positive HPV 16+.  3. Lichen sclerosus et atrophicus of the vulva Lichen sclerosus of the vulva controlled on clobetasol ointment twice a week.  Re-prescription sent to pharmacy.  Other orders - clobetasol ointment (TEMOVATE) 0.05 %; Apply 1 application topically 2 (two) times a week. Thin layer on vulva  Carol Mercer, it was a pleasure seeing you today!  I will inform you of your results as soon as they are available.

## 2019-03-17 NOTE — Progress Notes (Signed)
    Carol Mercer May 04, 1951 [id number]        68 y.o.  G3P3L3 Married  RP: H/O CIN 1 with HPV 16 pos for repeat Pap test  HPI: Last Pap test negative 09/2018.  H/O CIN 1 with HPV 16 pos on Colpo 12/2017.  Ran out of Clobetasol ointment for Lichen Sclerosus of the vulva.   OB History  Gravida Para Term Preterm AB Living  3 3       3   SAB TAB Ectopic Multiple Live Births               # Outcome Date GA Lbr Len/2nd Weight Sex Delivery Anes PTL Lv  3 Para           2 Para           1 Para             Past medical history,surgical history, problem list, medications, allergies, family history and social history were all reviewed and documented in the EPIC chart.   Directed ROS with pertinent positives and negatives documented in the history of present illness/assessment and plan.  Exam:  Vitals:   03/17/19 1415  BP: 122/76   General appearance:  Normal  Abdomen: Normal  Gyn exam:  Vulva Lichen Sclerosus.  Speculum:  Cervix/vagina normal.  Secretions normal.  Pap/HPV HR done.     Assessment/Plan:  68 y.o. G3P3   1. 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  Repeat Pap test November 2019 was negative.  Repeat Pap test with high-risk HPV done today.  2. Cervical high risk HPV (human papillomavirus) test positive HPV 16+.  3. Lichen sclerosus et atrophicus of the vulva Lichen sclerosus of the vulva controlled on clobetasol ointment twice a week.  Re-prescription sent to pharmacy.  Other orders - clobetasol ointment (TEMOVATE) 0.05 %; Apply 1 application topically 2 (two) times a week. Thin layer on vulva  Counseling on above issues and coordination of care more than 50% for 15 minutes.  Genia Del MD, 2:30 PM 03/17/2019

## 2019-03-18 LAB — PAP, TP IMAGING W/ HPV RNA, RFLX HPV TYPE 16,18/45: HPV DNA High Risk: NOT DETECTED

## 2019-03-23 ENCOUNTER — Other Ambulatory Visit: Payer: Self-pay | Admitting: Family Medicine

## 2019-03-23 DIAGNOSIS — Z8673 Personal history of transient ischemic attack (TIA), and cerebral infarction without residual deficits: Secondary | ICD-10-CM

## 2019-04-02 ENCOUNTER — Other Ambulatory Visit: Payer: Self-pay | Admitting: Obstetrics & Gynecology

## 2019-04-02 MED ORDER — FLUCONAZOLE 150 MG PO TABS: ORAL_TABLET | ORAL | 0 refills | Status: AC

## 2019-04-21 ENCOUNTER — Other Ambulatory Visit: Payer: Self-pay | Admitting: Family Medicine

## 2019-04-21 DIAGNOSIS — Z1231 Encounter for screening mammogram for malignant neoplasm of breast: Secondary | ICD-10-CM

## 2019-04-30 ENCOUNTER — Other Ambulatory Visit: Payer: Self-pay | Admitting: Nephrology

## 2019-04-30 DIAGNOSIS — N183 Chronic kidney disease, stage 3 unspecified: Secondary | ICD-10-CM

## 2019-05-12 ENCOUNTER — Ambulatory Visit
Admission: RE | Admit: 2019-05-12 | Discharge: 2019-05-12 | Disposition: A | Discharge status: Home / Self Care | Payer: Medicare Other | Source: Ambulatory Visit | Attending: Nephrology | Admitting: Nephrology

## 2019-05-12 DIAGNOSIS — N183 Chronic kidney disease, stage 3 unspecified: Secondary | ICD-10-CM

## 2019-05-19 ENCOUNTER — Other Ambulatory Visit: Payer: Self-pay | Admitting: Internal Medicine

## 2019-05-19 DIAGNOSIS — Z20828 Contact with and (suspected) exposure to other viral communicable diseases: Secondary | ICD-10-CM

## 2019-05-19 DIAGNOSIS — Z20822 Contact with and (suspected) exposure to covid-19: Secondary | ICD-10-CM

## 2019-05-20 ENCOUNTER — Other Ambulatory Visit: Payer: Medicare Other

## 2019-05-20 DIAGNOSIS — Z20828 Contact with and (suspected) exposure to other viral communicable diseases: Secondary | ICD-10-CM

## 2019-05-20 DIAGNOSIS — Z20822 Contact with and (suspected) exposure to covid-19: Secondary | ICD-10-CM

## 2019-05-23 LAB — NOVEL CORONAVIRUS, NAA: SARS-CoV-2, NAA: NOT DETECTED

## 2019-05-25 ENCOUNTER — Telehealth: Payer: Self-pay | Admitting: Family Medicine

## 2019-05-25 NOTE — Telephone Encounter (Signed)
Pt was given (not detected) covid-19 result/ Pt verbalized understanding °

## 2019-06-04 ENCOUNTER — Ambulatory Visit
Admission: RE | Admit: 2019-06-04 | Discharge: 2019-06-04 | Disposition: A | Discharge status: Home / Self Care | Payer: BLUE CROSS/BLUE SHIELD | Source: Ambulatory Visit | Attending: Family Medicine | Admitting: Family Medicine

## 2019-06-04 ENCOUNTER — Other Ambulatory Visit: Payer: Self-pay

## 2019-06-04 DIAGNOSIS — Z1231 Encounter for screening mammogram for malignant neoplasm of breast: Secondary | ICD-10-CM

## 2019-06-25 ENCOUNTER — Ambulatory Visit
Admission: RE | Admit: 2019-06-25 | Discharge: 2019-06-25 | Disposition: A | Discharge status: Home / Self Care | Payer: Medicare Other | Source: Ambulatory Visit | Attending: Family Medicine | Admitting: Family Medicine

## 2019-06-25 DIAGNOSIS — Z8673 Personal history of transient ischemic attack (TIA), and cerebral infarction without residual deficits: Secondary | ICD-10-CM

## 2020-01-03 ENCOUNTER — Ambulatory Visit: Payer: Medicare Other | Attending: Internal Medicine

## 2020-01-03 DIAGNOSIS — Z23 Encounter for immunization: Secondary | ICD-10-CM | POA: Insufficient documentation

## 2020-01-03 NOTE — Progress Notes (Signed)
   Covid-19 Vaccination Clinic  Name:  Carol Mercer    MRN: [id number] DOB: 01/24/1951  01/03/2020  [name] was observed post Covid-19 immunization for 15 minutes without incidence. She was provided with Vaccine Information Sheet and instruction to access the V-Safe system.   [name] was instructed to call 911 with any severe reactions post vaccine: Marland Kitchen Difficulty breathing  . Swelling of your face and throat  . A fast heartbeat  . A bad rash all over your body  . Dizziness and weakness    Immunizations Administered    Name Date Dose VIS Date Route   Pfizer COVID-19 Vaccine 01/03/2020 11:19 AM 0.3 mL 10/16/2019 Intramuscular   Manufacturer: [employer], [employer]   Lot: [id number]   NDC: [address]-3

## 2020-01-27 ENCOUNTER — Ambulatory Visit: Payer: Medicare Other | Attending: Internal Medicine

## 2020-01-27 DIAGNOSIS — Z23 Encounter for immunization: Secondary | ICD-10-CM

## 2020-01-27 NOTE — Progress Notes (Signed)
   Covid-19 Vaccination Clinic  Name:  Carol Mercer    MRN: [id number] DOB: 1951/07/24  01/27/2020  [name] was observed post Covid-19 immunization for 15 minutes without incident. She was provided with Vaccine Information Sheet and instruction to access the V-Safe system.   [name] was instructed to call 911 with any severe reactions post vaccine: Marland Kitchen Difficulty breathing  . Swelling of face and throat  . A fast heartbeat  . A bad rash all over body  . Dizziness and weakness   Immunizations Administered    Name Date Dose VIS Date Route   Pfizer COVID-19 Vaccine 01/27/2020  2:07 PM 0.3 mL 10/16/2019 Intramuscular   Manufacturer: [employer], [employer]   Lot: [id number]   NDC: [address]-4

## 2020-05-04 ENCOUNTER — Other Ambulatory Visit: Payer: Self-pay | Admitting: Family Medicine

## 2020-05-04 DIAGNOSIS — Z1231 Encounter for screening mammogram for malignant neoplasm of breast: Secondary | ICD-10-CM

## 2020-06-09 ENCOUNTER — Other Ambulatory Visit: Payer: Self-pay

## 2020-06-09 ENCOUNTER — Ambulatory Visit
Admission: RE | Admit: 2020-06-09 | Discharge: 2020-06-09 | Disposition: A | Discharge status: Home / Self Care | Payer: Medicare Other | Source: Ambulatory Visit | Attending: Family Medicine | Admitting: Family Medicine

## 2020-06-09 DIAGNOSIS — Z1231 Encounter for screening mammogram for malignant neoplasm of breast: Secondary | ICD-10-CM

## 2021-01-12 DIAGNOSIS — E119 Type 2 diabetes mellitus without complications: Secondary | ICD-10-CM | POA: Diagnosis not present

## 2021-01-12 DIAGNOSIS — I10 Essential (primary) hypertension: Secondary | ICD-10-CM | POA: Diagnosis not present

## 2021-01-12 DIAGNOSIS — N189 Chronic kidney disease, unspecified: Secondary | ICD-10-CM | POA: Diagnosis not present

## 2021-01-12 DIAGNOSIS — I639 Cerebral infarction, unspecified: Secondary | ICD-10-CM | POA: Diagnosis not present

## 2021-01-12 DIAGNOSIS — E118 Type 2 diabetes mellitus with unspecified complications: Secondary | ICD-10-CM | POA: Diagnosis not present

## 2021-01-12 DIAGNOSIS — E7800 Pure hypercholesterolemia, unspecified: Secondary | ICD-10-CM | POA: Diagnosis not present

## 2021-05-15 ENCOUNTER — Other Ambulatory Visit: Payer: Self-pay | Admitting: Family Medicine

## 2021-05-15 DIAGNOSIS — Z1231 Encounter for screening mammogram for malignant neoplasm of breast: Secondary | ICD-10-CM

## 2021-07-06 ENCOUNTER — Ambulatory Visit
Admission: RE | Admit: 2021-07-06 | Discharge: 2021-07-06 | Disposition: A | Discharge status: Home / Self Care | Payer: Medicare Other | Source: Ambulatory Visit | Attending: Family Medicine | Admitting: Family Medicine

## 2021-07-06 ENCOUNTER — Other Ambulatory Visit: Payer: Self-pay

## 2021-07-06 DIAGNOSIS — Z1231 Encounter for screening mammogram for malignant neoplasm of breast: Secondary | ICD-10-CM

## 2022-05-28 ENCOUNTER — Other Ambulatory Visit: Payer: Self-pay | Admitting: Family Medicine

## 2022-05-28 DIAGNOSIS — Z1231 Encounter for screening mammogram for malignant neoplasm of breast: Secondary | ICD-10-CM

## 2022-07-16 ENCOUNTER — Ambulatory Visit
Admission: RE | Admit: 2022-07-16 | Discharge: 2022-07-16 | Disposition: A | Discharge status: Home / Self Care | Payer: Medicare Other | Source: Ambulatory Visit | Attending: Family Medicine | Admitting: Family Medicine

## 2022-07-16 DIAGNOSIS — Z1231 Encounter for screening mammogram for malignant neoplasm of breast: Secondary | ICD-10-CM

## 2023-07-17 ENCOUNTER — Other Ambulatory Visit: Payer: Self-pay | Admitting: Family Medicine

## 2023-07-17 ENCOUNTER — Encounter: Payer: Self-pay | Admitting: Family Medicine

## 2023-07-17 DIAGNOSIS — Z0000 Encounter for general adult medical examination without abnormal findings: Secondary | ICD-10-CM

## 2023-07-17 DIAGNOSIS — Z1231 Encounter for screening mammogram for malignant neoplasm of breast: Secondary | ICD-10-CM

## 2023-07-22 ENCOUNTER — Ambulatory Visit
Admission: RE | Admit: 2023-07-22 | Discharge: 2023-07-22 | Disposition: A | Discharge status: Home / Self Care | Payer: Medicare Other | Source: Ambulatory Visit | Attending: Family Medicine | Admitting: Family Medicine

## 2023-07-22 DIAGNOSIS — Z0000 Encounter for general adult medical examination without abnormal findings: Secondary | ICD-10-CM

## 2024-01-27 DIAGNOSIS — L84 Corns and callosities: Secondary | ICD-10-CM | POA: Diagnosis not present

## 2024-01-27 DIAGNOSIS — Z23 Encounter for immunization: Secondary | ICD-10-CM | POA: Diagnosis not present

## 2024-01-27 DIAGNOSIS — N1831 Chronic kidney disease, stage 3a: Secondary | ICD-10-CM | POA: Diagnosis not present

## 2024-01-27 DIAGNOSIS — E785 Hyperlipidemia, unspecified: Secondary | ICD-10-CM | POA: Diagnosis not present

## 2024-01-27 DIAGNOSIS — E1169 Type 2 diabetes mellitus with other specified complication: Secondary | ICD-10-CM | POA: Diagnosis not present

## 2024-01-27 DIAGNOSIS — I10 Essential (primary) hypertension: Secondary | ICD-10-CM | POA: Diagnosis not present

## 2024-02-20 ENCOUNTER — Ambulatory Visit: Admitting: Podiatry

## 2024-02-20 DIAGNOSIS — B351 Tinea unguium: Secondary | ICD-10-CM | POA: Diagnosis not present

## 2024-02-20 DIAGNOSIS — Z79899 Other long term (current) drug therapy: Secondary | ICD-10-CM | POA: Diagnosis not present

## 2024-02-20 NOTE — Progress Notes (Signed)
 Subjective:  Patient ID: Carol Mercer, female    DOB: [birth year],  MRN: [id number]  Chief Complaint  Patient presents with   Nail Problem    73 y.o. female presents with the above complaint.  Patient presents with complaint of thickened and onychodystrophy mycotic toenails x 10 mild pain on palpation worse with ambulation is with pressure she would like to discuss treatment options for this she has not seen anyone as prior to seeing me denies any other acute complaints.   Review of Systems: Negative except as noted in the HPI. Denies N/V/F/Ch.  Past Medical History:  Diagnosis Date   Diabetes mellitus without complication (HCC)    High cholesterol    Hypertension     Current Outpatient Medications:    terbinafine  (LAMISIL ) 250 MG tablet, Take 1 tablet (250 mg total) by mouth daily., Disp: 90 tablet, Rfl: 0   aspirin EC 81 MG tablet, Take 81 mg by mouth daily., Disp: , Rfl:    cholecalciferol (VITAMIN D) 1000 units tablet, Take 1,000 Units by mouth daily., Disp: , Rfl:    clobetasol  ointment (TEMOVATE ) 0.05 %, Apply 1 application topically 2 (two) times a week. Thin layer on vulva, Disp: 30 g, Rfl: 3   fluconazole  (DIFLUCAN ) 150 MG tablet, Take one tab po daily x 3 days., Disp: 3 tablet, Rfl: 0   Folic Acid 5 MG CAPS, Take by mouth., Disp: , Rfl:    lisinopril (PRINIVIL,ZESTRIL) 20 MG tablet, Take 20 mg by mouth daily., Disp: , Rfl:    metFORMIN (GLUCOPHAGE) 500 MG tablet, Take by mouth 2 (two) times daily with a meal., Disp: , Rfl:    rosuvastatin (CRESTOR) 40 MG tablet, Take 40 mg by mouth daily., Disp: , Rfl:   Social History   Tobacco Use  Smoking Status Former   Current packs/day: 0.00   Types: Cigarettes   Quit date: 03/29/1984   Years since quitting: 39.9  Smokeless Tobacco Never    Allergies  Allergen Reactions   Penicillins Rash   Objective:  There were no vitals filed for this visit. There is no height or weight on file to calculate BMI. Constitutional  Well developed. Well nourished.  Vascular Dorsalis pedis pulses palpable bilaterally. Posterior tibial pulses palpable bilaterally. Capillary refill normal to all digits.  No cyanosis or clubbing noted. Pedal hair growth normal.  Neurologic Normal speech. Oriented to person, place, and time. Epicritic sensation to light touch grossly present bilaterally.  Dermatologic Nails thickened elongated dystrophic mycotic toenails x 10 Skin within normal limits  Orthopedic: Normal joint ROM without pain or crepitus bilaterally. No visible deformities. No bony tenderness.   Radiographs: None Assessment:   1. Long-term use of high-risk medication   2. Nail fungus   3. Onychomycosis due to dermatophyte    Plan:  Patient was evaluated and treated and all questions answered.  Onychomycosis toenails x 10 -Educated the patient on the etiology of onychomycosis and various treatment options associated with improving the fungal load.  I explained to the patient that there is 3 treatment options available to treat the onychomycosis including topical, p.o., laser treatment.  Patient elected to undergo p.o. options with Lamisil /terbinafine  therapy.  In order for me to start the medication therapy, I explained to the patient the importance of evaluating the liver and obtaining the liver function test.  Once the liver function test comes back normal I will start him on 25-month course of Lamisil  therapy.  Patient understood all risk and would like to proceed  with Lamisil  therapy.  I have asked the patient to immediately stop the Lamisil  therapy if she has any reactions to it and call the office or go to the emergency room right away.  Patient states understanding - Patient will also like to be scheduled for laser as well.  She would like to concurrently do laser and Lamisil   No follow-ups on file.

## 2024-02-21 LAB — HEPATIC FUNCTION PANEL
ALT: 10 IU/L (ref 0–32)
AST: 20 IU/L (ref 0–40)
Albumin: 4.3 g/dL (ref 3.9–4.9)
Alkaline Phosphatase: 109 IU/L (ref 44–121)
Bilirubin Total: 0.5 mg/dL (ref 0.0–1.2)
Bilirubin, Direct: 0.14 mg/dL (ref 0.00–0.40)
Total Protein: 6.9 g/dL (ref 6.0–8.5)

## 2024-02-24 MED ORDER — TERBINAFINE HCL 250 MG PO TABS: 250.0000 mg | ORAL_TABLET | Freq: Every day | ORAL | 0 refills | Status: AC

## 2024-04-10 ENCOUNTER — Other Ambulatory Visit

## 2024-05-15 ENCOUNTER — Other Ambulatory Visit

## 2024-05-29 DIAGNOSIS — Z780 Asymptomatic menopausal state: Secondary | ICD-10-CM | POA: Diagnosis not present

## 2024-05-29 DIAGNOSIS — Z1211 Encounter for screening for malignant neoplasm of colon: Secondary | ICD-10-CM | POA: Diagnosis not present

## 2024-05-29 DIAGNOSIS — I10 Essential (primary) hypertension: Secondary | ICD-10-CM | POA: Diagnosis not present

## 2024-05-29 DIAGNOSIS — E785 Hyperlipidemia, unspecified: Secondary | ICD-10-CM | POA: Diagnosis not present

## 2024-05-29 DIAGNOSIS — E1169 Type 2 diabetes mellitus with other specified complication: Secondary | ICD-10-CM | POA: Diagnosis not present

## 2024-05-29 DIAGNOSIS — Z23 Encounter for immunization: Secondary | ICD-10-CM | POA: Diagnosis not present

## 2024-05-29 DIAGNOSIS — N1831 Chronic kidney disease, stage 3a: Secondary | ICD-10-CM | POA: Diagnosis not present

## 2024-05-29 DIAGNOSIS — E559 Vitamin D deficiency, unspecified: Secondary | ICD-10-CM | POA: Diagnosis not present

## 2024-05-29 DIAGNOSIS — Z0000 Encounter for general adult medical examination without abnormal findings: Secondary | ICD-10-CM | POA: Diagnosis not present

## 2024-06-04 DIAGNOSIS — E1349 Other specified diabetes mellitus with other diabetic neurological complication: Secondary | ICD-10-CM | POA: Diagnosis not present

## 2024-06-04 DIAGNOSIS — J4520 Mild intermittent asthma, uncomplicated: Secondary | ICD-10-CM | POA: Diagnosis not present

## 2024-06-04 DIAGNOSIS — E1169 Type 2 diabetes mellitus with other specified complication: Secondary | ICD-10-CM | POA: Diagnosis not present

## 2024-06-04 DIAGNOSIS — J4521 Mild intermittent asthma with (acute) exacerbation: Secondary | ICD-10-CM | POA: Diagnosis not present

## 2024-06-12 ENCOUNTER — Other Ambulatory Visit

## 2024-06-12 DIAGNOSIS — I10 Essential (primary) hypertension: Secondary | ICD-10-CM | POA: Diagnosis not present

## 2024-06-12 DIAGNOSIS — M25561 Pain in right knee: Secondary | ICD-10-CM | POA: Diagnosis not present

## 2024-06-25 ENCOUNTER — Other Ambulatory Visit: Payer: Self-pay

## 2024-06-25 ENCOUNTER — Ambulatory Visit: Admitting: Orthopedic Surgery

## 2024-06-25 DIAGNOSIS — M25562 Pain in left knee: Secondary | ICD-10-CM | POA: Diagnosis not present

## 2024-06-27 ENCOUNTER — Encounter: Payer: Self-pay | Admitting: Orthopedic Surgery

## 2024-06-27 NOTE — Progress Notes (Signed)
 Office Visit Note   Patient: Carol Mercer           Date of Birth: 29-Dec-1950           MRN: [id number] Visit Date: 06/25/2024 Requested by: Claudene Lacks, MD [address] [place],  [place] [address] PCP: Claudene Lacks, MD  Subjective: Chief Complaint  Patient presents with   Right Knee - Pain    HPI: Carol Mercer is a 73 y.o. female who presents to the office reporting right knee pain.  Patient had a fall 1 month ago.  Tripped over a long dress while she was bringing in the trash and she landed as a direct impact on her knee.  Does report some swelling but no weakness giving way and no instability.  Does describe some catching symptoms.  Does have painful range of motion with bending.  Overall she is a little bit better than she was a month ago.  Patient does have diabetes and takes metformin with recent A1c unknown.  Uses Tylenol for symptoms.  She actually works and is working on the day after this visit.  She states her CBG machine is broken..                ROS: All systems reviewed are negative as they relate to the chief complaint within the history of present illness.  Patient denies fevers or chills.  Assessment & Plan: Visit Diagnoses:  1. Left knee pain, unspecified chronicity     Plan: Impression is right knee pain with medial compartment arthritis on plain radiographs but no fracture.  Plan is aspiration and injection of the knee likely with Toradol unless she has her CBG equipment organized before her next clinic visit.  She cannot do it today but wants to come back on Monday which we may or may not be able to accommodate.  We will plan for injection only visit on Monday with either me or [facility].We will see if that helps her symptoms.  Overall I think she has exacerbated some existing knee arthritis and that should improve with an injection reset.  Follow-Up Instructions: No follow-ups on file.   Orders:  No orders of the defined types were placed in this  encounter.  No orders of the defined types were placed in this encounter.     Procedures: No procedures performed   Clinical Data: No additional findings.  Objective: Vital Signs: There were no vitals taken for this visit.  Physical Exam:  Constitutional: Patient appears well-developed HEENT:  Head: Normocephalic Eyes:EOM are normal Neck: Normal range of motion Cardiovascular: Normal rate Pulmonary/chest: Effort normal Neurologic: Patient is alert Skin: Skin is warm Psychiatric: Patient has normal mood and affect  Ortho Exam: Ortho exam demonstrates normal gait alignment.  Trace effusion of the right knee.  Medial greater than lateral joint line tenderness.  Pedal pulses palpable.  Collateral cruciate ligaments are stable in the knee with no groin pain with internal or external rotation of the right leg.  Essentially has range of motion of the 0-1 20.  Specialty Comments:  No specialty comments available.  Imaging: No results found.   PMFS History: There are no active problems to display for this patient.  Past Medical History:  Diagnosis Date   Diabetes mellitus without complication (HCC)    High cholesterol    Hypertension     Family History  Problem Relation Age of Onset   Heart attack Father    Diabetes Sister    Breast  cancer Neg Hx     Past Surgical History:  Procedure Laterality Date   CESAREAN SECTION     X3   COLONOSCOPY     TUBAL LIGATION     VEIN LIGATION     Social History   Occupational History   Not on file  Tobacco Use   Smoking status: Former    Current packs/day: 0.00    Types: Cigarettes    Quit date: 03/29/1984    Years since quitting: 40.2   Smokeless tobacco: Never  Vaping Use   Vaping status: Never Used  Substance and Sexual Activity   Alcohol use: No   Drug use: No   Sexual activity: Yes

## 2024-06-29 ENCOUNTER — Ambulatory Visit: Admitting: Orthopedic Surgery

## 2024-06-29 DIAGNOSIS — M25461 Effusion, right knee: Secondary | ICD-10-CM

## 2024-06-30 ENCOUNTER — Encounter: Payer: Self-pay | Admitting: Orthopedic Surgery

## 2024-06-30 DIAGNOSIS — M25461 Effusion, right knee: Secondary | ICD-10-CM

## 2024-06-30 MED ORDER — LIDOCAINE HCL 1 % IJ SOLN
5.0000 mL | INTRAMUSCULAR | Status: AC | PRN
  Administered 2024-06-30: 5 mL

## 2024-06-30 NOTE — Progress Notes (Signed)
   Procedure Note  Patient: Carol Mercer             Date of Birth: 19-Oct-1951           MRN: [id number]             Visit Date: 06/29/2024  Procedures: Visit Diagnoses: No diagnosis found.  Large Joint Inj: R knee on 06/30/2024 3:18 PM Indications: diagnostic evaluation, joint swelling and pain Details: 18 G 1.5 in needle, superolateral approach  Arthrogram: No  Medications: 5 mL lidocaine  1 % Outcome: tolerated well, no immediate complications Procedure, treatment alternatives, risks and benefits explained, specific risks discussed. Consent was given by the patient. Immediately prior to procedure a time out was called to verify the correct patient, procedure, equipment, support staff and site/side marked as required. Patient was prepped and draped in the usual sterile fashion.     Toradol injected

## 2024-07-05 DIAGNOSIS — J4521 Mild intermittent asthma with (acute) exacerbation: Secondary | ICD-10-CM | POA: Diagnosis not present

## 2024-07-05 DIAGNOSIS — J4520 Mild intermittent asthma, uncomplicated: Secondary | ICD-10-CM | POA: Diagnosis not present

## 2024-07-05 DIAGNOSIS — E1169 Type 2 diabetes mellitus with other specified complication: Secondary | ICD-10-CM | POA: Diagnosis not present

## 2024-07-05 DIAGNOSIS — E1349 Other specified diabetes mellitus with other diabetic neurological complication: Secondary | ICD-10-CM | POA: Diagnosis not present

## 2024-08-04 DIAGNOSIS — J4520 Mild intermittent asthma, uncomplicated: Secondary | ICD-10-CM | POA: Diagnosis not present

## 2024-08-04 DIAGNOSIS — J4521 Mild intermittent asthma with (acute) exacerbation: Secondary | ICD-10-CM | POA: Diagnosis not present

## 2024-08-04 DIAGNOSIS — E1169 Type 2 diabetes mellitus with other specified complication: Secondary | ICD-10-CM | POA: Diagnosis not present

## 2024-08-19 DIAGNOSIS — Z1231 Encounter for screening mammogram for malignant neoplasm of breast: Secondary | ICD-10-CM | POA: Diagnosis not present

## 2024-08-19 DIAGNOSIS — N1831 Chronic kidney disease, stage 3a: Secondary | ICD-10-CM | POA: Diagnosis not present

## 2024-08-19 DIAGNOSIS — E785 Hyperlipidemia, unspecified: Secondary | ICD-10-CM | POA: Diagnosis not present

## 2024-08-19 DIAGNOSIS — E1169 Type 2 diabetes mellitus with other specified complication: Secondary | ICD-10-CM | POA: Diagnosis not present

## 2024-08-19 DIAGNOSIS — I10 Essential (primary) hypertension: Secondary | ICD-10-CM | POA: Diagnosis not present

## 2024-08-19 DIAGNOSIS — Z1211 Encounter for screening for malignant neoplasm of colon: Secondary | ICD-10-CM | POA: Diagnosis not present

## 2024-08-19 DIAGNOSIS — Z23 Encounter for immunization: Secondary | ICD-10-CM | POA: Diagnosis not present
